# Patient Record
Sex: Male | Born: 2000 | Race: Black or African American | Hispanic: No | Marital: Single | Smoking: Never smoker
Health system: Southern US, Community
[De-identification: ages and names within clinical notes are randomized; demographics above are authoritative.]

## PROBLEM LIST (undated history)

## (undated) DIAGNOSIS — J45909 Unspecified asthma, uncomplicated: Secondary | ICD-10-CM

## (undated) DIAGNOSIS — Q699 Polydactyly, unspecified: Secondary | ICD-10-CM

## (undated) DIAGNOSIS — I82402 Acute embolism and thrombosis of unspecified deep veins of left lower extremity: Secondary | ICD-10-CM

## (undated) HISTORY — PX: URETHRA SURGERY: SHX824

## (undated) HISTORY — PX: ARTHROSCOPIC REPAIR ACL: SUR80

---
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
## 2020-08-23 ENCOUNTER — Encounter (HOSPITAL_BASED_OUTPATIENT_CLINIC_OR_DEPARTMENT_OTHER): Payer: Self-pay | Admitting: Orthopaedic Surgery

## 2020-08-23 ENCOUNTER — Other Ambulatory Visit (HOSPITAL_COMMUNITY)
Admission: RE | Admit: 2020-08-23 | Discharge: 2020-08-23 | Disposition: A | Discharge status: Home / Self Care | Payer: BC Managed Care – PPO | Source: Ambulatory Visit | Attending: Orthopaedic Surgery | Admitting: Orthopaedic Surgery

## 2020-08-23 ENCOUNTER — Other Ambulatory Visit: Payer: Self-pay

## 2020-08-23 DIAGNOSIS — Y838 Other surgical procedures as the cause of abnormal reaction of the patient, or of later complication, without mention of misadventure at the time of the procedure: Secondary | ICD-10-CM | POA: Diagnosis not present

## 2020-08-23 DIAGNOSIS — Z86718 Personal history of other venous thrombosis and embolism: Secondary | ICD-10-CM | POA: Diagnosis not present

## 2020-08-23 DIAGNOSIS — U071 COVID-19: Secondary | ICD-10-CM | POA: Insufficient documentation

## 2020-08-23 DIAGNOSIS — Z01812 Encounter for preprocedural laboratory examination: Secondary | ICD-10-CM | POA: Insufficient documentation

## 2020-08-23 DIAGNOSIS — Z7901 Long term (current) use of anticoagulants: Secondary | ICD-10-CM | POA: Diagnosis not present

## 2020-08-23 DIAGNOSIS — L02416 Cutaneous abscess of left lower limb: Secondary | ICD-10-CM | POA: Diagnosis not present

## 2020-08-23 DIAGNOSIS — T8142XA Infection following a procedure, deep incisional surgical site, initial encounter: Secondary | ICD-10-CM | POA: Diagnosis not present

## 2020-08-23 LAB — SARS CORONAVIRUS 2 (TAT 6-24 HRS): SARS Coronavirus 2: POSITIVE — AB

## 2020-08-24 ENCOUNTER — Other Ambulatory Visit: Payer: Self-pay

## 2020-08-24 ENCOUNTER — Telehealth: Payer: Self-pay | Admitting: Hematology and Oncology

## 2020-08-24 ENCOUNTER — Encounter (HOSPITAL_COMMUNITY): Payer: Self-pay | Admitting: Orthopaedic Surgery

## 2020-08-24 NOTE — Telephone Encounter (Signed)
Received a new hem referral from [facility] for +dvt. Blake Perry's mom returned my call and has been scheduled to see Dr. Al Pimple on 2/2 at 1pm. Aware to arrive 20 minutes early.

## 2020-08-24 NOTE — Progress Notes (Signed)
  [id number] [place], [place] - [phone] AT [facility] [address] [place] [address] Phone: [phone] Fax: [phone]  [id number] - [place], [place] - [address] AT [facility] [address] [place] [address] Phone: [phone] Fax: [phone]   Instructed patient to call [phone] on arrival to hospital main entrance and that we would come get him from the car.    Your procedure is scheduled on January Thursday 27th .  Report to [facility] Main Entrance "A" at 9:30 A.M., and check in at the Admitting office.  Call this number if you have problems the morning of surgery:  [phone]  Call [phone] if you have any questions prior to your surgery date Monday-Friday 8am-4pm    Remember:  Do not eat after midnight the night before your surgery  You may drink clear liquids until 9am the morning of your surgery.   Clear liquids allowed are: Water, Non-Citrus Juices (without pulp), Carbonated Beverages, Clear Tea, Black Coffee Only, and Gatorade    Take these medicines the morning of surgery with A SIP OF WATER NONE Patient stated no Eliquis in 2 weeks and Lovenox last taken 08/24/20 - takes Lovenox in late afternoon - 1630.  As of today, STOP taking any Aspirin (unless otherwise instructed by your surgeon) Aleve, Naproxen, Ibuprofen, Motrin, Advil, Goody's, BC's, all herbal medications, fish oil, and all vitamins.                      Do not wear jewelry            Do not wear lotions, powders, colognes, or deodorant.            Do not shave 48 hours prior to surgery.  Men may shave face and neck.            Do not bring valuables to the hospital.            [facility] is not responsible for any belongings or valuables.  Do NOT Smoke (Tobacco/Vaping) or drink Alcohol 24 hours prior to your procedure   Contacts, glasses, dentures or bridgework may not be worn into  surgery.      Patients discharged the day of surgery will not be allowed to drive home, and someone needs to stay with them for 24 hours.

## 2020-08-24 NOTE — Progress Notes (Signed)
Patient with positive covid result. Contacted MD and informed of result.   

## 2020-08-25 ENCOUNTER — Encounter (HOSPITAL_COMMUNITY)
Admission: RE | Disposition: A | Discharge status: Home / Self Care | Payer: Self-pay | Source: Home / Self Care | Attending: Orthopaedic Surgery

## 2020-08-25 ENCOUNTER — Ambulatory Visit (HOSPITAL_COMMUNITY)
Admission: RE | Admit: 2020-08-25 | Discharge: 2020-08-25 | Disposition: A | Discharge status: Home / Self Care | Payer: BC Managed Care – PPO | Attending: Orthopaedic Surgery | Admitting: Orthopaedic Surgery

## 2020-08-25 ENCOUNTER — Encounter (HOSPITAL_COMMUNITY): Payer: Self-pay | Admitting: Orthopaedic Surgery

## 2020-08-25 ENCOUNTER — Ambulatory Visit (HOSPITAL_COMMUNITY): Payer: BC Managed Care – PPO | Admitting: Anesthesiology

## 2020-08-25 DIAGNOSIS — L02416 Cutaneous abscess of left lower limb: Secondary | ICD-10-CM | POA: Insufficient documentation

## 2020-08-25 DIAGNOSIS — T8142XA Infection following a procedure, deep incisional surgical site, initial encounter: Secondary | ICD-10-CM | POA: Insufficient documentation

## 2020-08-25 DIAGNOSIS — Z86718 Personal history of other venous thrombosis and embolism: Secondary | ICD-10-CM | POA: Insufficient documentation

## 2020-08-25 DIAGNOSIS — Z7901 Long term (current) use of anticoagulants: Secondary | ICD-10-CM | POA: Insufficient documentation

## 2020-08-25 DIAGNOSIS — U071 COVID-19: Secondary | ICD-10-CM | POA: Insufficient documentation

## 2020-08-25 DIAGNOSIS — Z8616 Personal history of COVID-19: Secondary | ICD-10-CM | POA: Insufficient documentation

## 2020-08-25 DIAGNOSIS — Y838 Other surgical procedures as the cause of abnormal reaction of the patient, or of later complication, without mention of misadventure at the time of the procedure: Secondary | ICD-10-CM | POA: Insufficient documentation

## 2020-08-25 HISTORY — DX: Unspecified asthma, uncomplicated: J45.909

## 2020-08-25 HISTORY — DX: Acute embolism and thrombosis of unspecified deep veins of left lower extremity: I82.402

## 2020-08-25 HISTORY — PX: KNEE ARTHROSCOPY: SHX127

## 2020-08-25 HISTORY — PX: INCISION AND DRAINAGE: SHX5863

## 2020-08-25 LAB — POCT I-STAT, CHEM 8
BUN: 9 mg/dL (ref 6–20)
Calcium, Ion: 1.3 mmol/L (ref 1.15–1.40)
Chloride: 98 mmol/L (ref 98–111)
Creatinine, Ser: 0.8 mg/dL (ref 0.61–1.24)
Glucose, Bld: 97 mg/dL (ref 70–99)
HCT: 42 % (ref 39.0–52.0)
Hemoglobin: 14.3 g/dL (ref 13.0–17.0)
Potassium: 4.1 mmol/L (ref 3.5–5.1)
Sodium: 139 mmol/L (ref 135–145)
TCO2: 28 mmol/L (ref 22–32)

## 2020-08-25 LAB — CBC
HCT: 17.6 % — ABNORMAL LOW (ref 39.0–52.0)
Hemoglobin: 5 g/dL — CL (ref 13.0–17.0)
MCH: 28.6 pg (ref 26.0–34.0)
MCHC: 28.4 g/dL — ABNORMAL LOW (ref 30.0–36.0)
MCV: 100.6 fL — ABNORMAL HIGH (ref 80.0–100.0)
Platelets: 64 10*3/uL — ABNORMAL LOW (ref 150–400)
RBC: 1.75 MIL/uL — ABNORMAL LOW (ref 4.22–5.81)
RDW: 12.1 % (ref 11.5–15.5)
WBC: 1.6 10*3/uL — ABNORMAL LOW (ref 4.0–10.5)
nRBC: 0 % (ref 0.0–0.2)

## 2020-08-25 SURGERY — ARTHROSCOPY, KNEE
Anesthesia: General | Site: Knee | Laterality: Left

## 2020-08-25 MED ORDER — SODIUM CHLORIDE 0.9 % IR SOLN
Status: DC | PRN
  Administered 2020-08-25: 6000 mL
  Administered 2020-08-25: 3000 mL

## 2020-08-25 MED ORDER — VANCOMYCIN HCL 1000 MG IV SOLR
INTRAVENOUS | Status: AC
  Filled 2020-08-25: qty 1000

## 2020-08-25 MED ORDER — PROPOFOL 10 MG/ML IV BOLUS
INTRAVENOUS | Status: AC
  Filled 2020-08-25: qty 20

## 2020-08-25 MED ORDER — RIVAROXABAN 20 MG PO TABS: 20.0000 mg | ORAL_TABLET | Freq: Every day | ORAL | 0 refills | Status: DC

## 2020-08-25 MED ORDER — TOBRAMYCIN SULFATE 1.2 G IJ SOLR
INTRAMUSCULAR | Status: DC | PRN
  Administered 2020-08-25: 1.2 g

## 2020-08-25 MED ORDER — LIDOCAINE 2% (20 MG/ML) 5 ML SYRINGE
INTRAMUSCULAR | Status: DC | PRN
  Administered 2020-08-25: 100 mg via INTRAVENOUS

## 2020-08-25 MED ORDER — OXYCODONE HCL 5 MG PO TABS
ORAL_TABLET | ORAL | Status: AC
  Filled 2020-08-25: qty 1

## 2020-08-25 MED ORDER — CEFAZOLIN SODIUM-DEXTROSE 2-4 GM/100ML-% IV SOLN
2.0000 g | INTRAVENOUS | Status: AC
  Administered 2020-08-25: 2 g via INTRAVENOUS

## 2020-08-25 MED ORDER — CEFAZOLIN SODIUM-DEXTROSE 2-4 GM/100ML-% IV SOLN
INTRAVENOUS | Status: AC
  Filled 2020-08-25: qty 100

## 2020-08-25 MED ORDER — OXYCODONE HCL 5 MG PO TABS
5.0000 mg | ORAL_TABLET | Freq: Once | ORAL | Status: AC | PRN
  Administered 2020-08-25: 5 mg via ORAL

## 2020-08-25 MED ORDER — ONDANSETRON HCL 4 MG/2ML IJ SOLN
INTRAMUSCULAR | Status: DC | PRN
  Administered 2020-08-25: 4 mg via INTRAVENOUS

## 2020-08-25 MED ORDER — MIDAZOLAM HCL 2 MG/2ML IJ SOLN
INTRAMUSCULAR | Status: DC | PRN
  Administered 2020-08-25: 2 mg via INTRAVENOUS

## 2020-08-25 MED ORDER — OXYCODONE HCL 5 MG/5ML PO SOLN: 5.0000 mg | Freq: Once | ORAL | Status: AC | PRN

## 2020-08-25 MED ORDER — BUPIVACAINE HCL (PF) 0.25 % IJ SOLN
INTRAMUSCULAR | Status: AC
  Filled 2020-08-25: qty 30

## 2020-08-25 MED ORDER — CHLORHEXIDINE GLUCONATE 0.12 % MT SOLN: 15.0000 mL | OROMUCOSAL | Status: AC

## 2020-08-25 MED ORDER — FENTANYL CITRATE (PF) 250 MCG/5ML IJ SOLN
INTRAMUSCULAR | Status: DC | PRN
  Administered 2020-08-25: 150 ug via INTRAVENOUS
  Administered 2020-08-25 (×2): 50 ug via INTRAVENOUS

## 2020-08-25 MED ORDER — ONDANSETRON HCL 4 MG/2ML IJ SOLN: 4.0000 mg | Freq: Once | INTRAMUSCULAR | Status: DC | PRN

## 2020-08-25 MED ORDER — DEXAMETHASONE SODIUM PHOSPHATE 10 MG/ML IJ SOLN
INTRAMUSCULAR | Status: DC | PRN
  Administered 2020-08-25: 5 mg via INTRAVENOUS

## 2020-08-25 MED ORDER — CHLORHEXIDINE GLUCONATE 0.12 % MT SOLN
OROMUCOSAL | Status: AC
  Administered 2020-08-25: 15 mL via OROMUCOSAL
  Filled 2020-08-25: qty 15

## 2020-08-25 MED ORDER — HYDROMORPHONE HCL 1 MG/ML IJ SOLN: 0.2500 mg | INTRAMUSCULAR | Status: DC | PRN

## 2020-08-25 MED ORDER — OXYCODONE HCL 5 MG PO TABS: ORAL_TABLET | ORAL | 0 refills | Status: AC

## 2020-08-25 MED ORDER — ACETAMINOPHEN 500 MG PO TABS: 1000.0000 mg | ORAL_TABLET | Freq: Three times a day (TID) | ORAL | 0 refills | Status: AC

## 2020-08-25 MED ORDER — ONDANSETRON HCL 4 MG PO TABS: 4.0000 mg | ORAL_TABLET | Freq: Three times a day (TID) | ORAL | 1 refills | Status: AC | PRN

## 2020-08-25 MED ORDER — FENTANYL CITRATE (PF) 250 MCG/5ML IJ SOLN
INTRAMUSCULAR | Status: AC
  Filled 2020-08-25: qty 5

## 2020-08-25 MED ORDER — PROPOFOL 10 MG/ML IV BOLUS
INTRAVENOUS | Status: DC | PRN
  Administered 2020-08-25: 200 mg via INTRAVENOUS

## 2020-08-25 MED ORDER — BUPIVACAINE HCL (PF) 0.25 % IJ SOLN
INTRAMUSCULAR | Status: DC | PRN
  Administered 2020-08-25: 30 mL

## 2020-08-25 MED ORDER — BUPIVACAINE HCL (PF) 0.25 % IJ SOLN: INTRAMUSCULAR | Status: DC | PRN

## 2020-08-25 MED ORDER — MIDAZOLAM HCL 2 MG/2ML IJ SOLN
INTRAMUSCULAR | Status: AC
  Filled 2020-08-25: qty 2

## 2020-08-25 MED ORDER — VANCOMYCIN HCL 1000 MG IV SOLR
INTRAVENOUS | Status: DC | PRN
  Administered 2020-08-25: 1000 mg

## 2020-08-25 MED ORDER — LACTATED RINGERS IV SOLN: INTRAVENOUS | Status: DC

## 2020-08-25 MED ORDER — SULFAMETHOXAZOLE-TRIMETHOPRIM 800-160 MG PO TABS: 1.0000 | ORAL_TABLET | Freq: Two times a day (BID) | ORAL | 0 refills | Status: AC

## 2020-08-25 MED ORDER — 0.9 % SODIUM CHLORIDE (POUR BTL) OPTIME
TOPICAL | Status: DC | PRN
  Administered 2020-08-25: 1000 mL

## 2020-08-25 MED ORDER — BUPIVACAINE HCL 0.25 % IJ SOLN: INTRAMUSCULAR | Status: DC | PRN

## 2020-08-25 MED ORDER — TOBRAMYCIN SULFATE 1.2 G IJ SOLR
INTRAMUSCULAR | Status: AC
  Filled 2020-08-25: qty 1.2

## 2020-08-25 SURGICAL SUPPLY — 106 items
APL PRP STRL LF DISP 70% ISPRP (MISCELLANEOUS) ×2
BANDAGE ESMARK 6X9 LF (GAUZE/BANDAGES/DRESSINGS) ×2 IMPLANT
BLADE 4.2CUDA (BLADE) IMPLANT
BLADE AVERAGE 25X9 (BLADE) ×3 IMPLANT
BLADE CUDA 5.5 (BLADE) IMPLANT
BLADE CUDA GRT WHITE 3.5 (BLADE) IMPLANT
BLADE CUTTER GATOR 3.5 (BLADE) ×3 IMPLANT
BLADE CUTTER MENIS 5.5 (BLADE) IMPLANT
BLADE EXCALIBUR 4.0X13 (MISCELLANEOUS) ×3 IMPLANT
BLADE SURG 15 STRL LF DISP TIS (BLADE) ×2 IMPLANT
BLADE SURG 15 STRL SS (BLADE) ×3
BNDG CMPR 9X6 STRL LF SNTH (GAUZE/BANDAGES/DRESSINGS) ×2
BNDG ELASTIC 4X5.8 VLCR STR LF (GAUZE/BANDAGES/DRESSINGS) ×3 IMPLANT
BNDG ELASTIC 6X5.8 VLCR STR LF (GAUZE/BANDAGES/DRESSINGS) ×3 IMPLANT
BNDG ESMARK 6X9 LF (GAUZE/BANDAGES/DRESSINGS) ×3
BNDG GAUZE ELAST 4 BULKY (GAUZE/BANDAGES/DRESSINGS) ×2 IMPLANT
BUR EGG 3PK/BX (BURR) ×2 IMPLANT
BUR OVAL 4.0 (BURR) ×1 IMPLANT
CHLORAPREP W/TINT 26 (MISCELLANEOUS) ×3 IMPLANT
CLSR STERI-STRIP ANTIMIC 1/2X4 (GAUZE/BANDAGES/DRESSINGS) ×3 IMPLANT
CNTNR URN SCR LID CUP LEK RST (MISCELLANEOUS) IMPLANT
CONT SPEC 4OZ STRL OR WHT (MISCELLANEOUS)
COVER BACK TABLE 60X90IN (DRAPES) ×3 IMPLANT
COVER SURGICAL LIGHT HANDLE (MISCELLANEOUS) ×3 IMPLANT
COVER WAND RF STERILE (DRAPES) ×3 IMPLANT
CUFF TOURN SGL LL 12 NO SLV (MISCELLANEOUS) IMPLANT
CUFF TOURN SGL QUICK 34 (TOURNIQUET CUFF)
CUFF TRNQT CYL 34X4.125X (TOURNIQUET CUFF) IMPLANT
CUTTER MENISCUS  4.2MM (BLADE)
CUTTER MENISCUS 4.2MM (BLADE) IMPLANT
DECANTER SPIKE VIAL GLASS SM (MISCELLANEOUS) IMPLANT
DRAPE ARTHROSCOPY W/POUCH 114 (DRAPES) ×3 IMPLANT
DRAPE IMP U-DRAPE 54X76 (DRAPES) ×3 IMPLANT
DRAPE U-SHAPE 47X51 STRL (DRAPES) ×3 IMPLANT
DRSG EMULSION OIL 3X3 NADH (GAUZE/BANDAGES/DRESSINGS) IMPLANT
DRSG PAD ABDOMINAL 8X10 ST (GAUZE/BANDAGES/DRESSINGS) ×6 IMPLANT
DRSG TEGADERM 4X4.75 (GAUZE/BANDAGES/DRESSINGS) ×1 IMPLANT
DURAPREP 26ML APPLICATOR (WOUND CARE) ×3 IMPLANT
ELECT REM PT RETURN 9FT ADLT (ELECTROSURGICAL) ×3
ELECTRODE REM PT RTRN 9FT ADLT (ELECTROSURGICAL) ×2 IMPLANT
GAUZE SPONGE 4X4 12PLY STRL (GAUZE/BANDAGES/DRESSINGS) ×4 IMPLANT
GAUZE XEROFORM 1X8 LF (GAUZE/BANDAGES/DRESSINGS) ×2 IMPLANT
GAUZE XEROFORM 5X9 LF (GAUZE/BANDAGES/DRESSINGS) ×3 IMPLANT
GLOVE BIO SURGEON STRL SZ 6.5 (GLOVE) ×3 IMPLANT
GLOVE ECLIPSE 8.0 STRL XLNG CF (GLOVE) ×6 IMPLANT
GLOVE SRG 8 PF TXTR STRL LF DI (GLOVE) ×2 IMPLANT
GLOVE SURG UNDER LTX SZ6.5 (GLOVE) ×3 IMPLANT
GLOVE SURG UNDER POLY LF SZ8 (GLOVE) ×3
GOWN STRL REUS W/ TWL LRG LVL3 (GOWN DISPOSABLE) ×6 IMPLANT
GOWN STRL REUS W/ TWL XL LVL3 (GOWN DISPOSABLE) ×4 IMPLANT
GOWN STRL REUS W/TWL LRG LVL3 (GOWN DISPOSABLE) ×9
GOWN STRL REUS W/TWL XL LVL3 (GOWN DISPOSABLE) ×6
HANDPIECE INTERPULSE COAX TIP (DISPOSABLE)
IMMOBILIZER KNEE 22 UNIV (SOFTGOODS) ×2 IMPLANT
IMMOBILIZER KNEE 24 THIGH 36 (MISCELLANEOUS) ×2 IMPLANT
IMMOBILIZER KNEE 24 UNIV (MISCELLANEOUS)
IV NS IRRIG 3000ML ARTHROMATIC (IV SOLUTION) ×12 IMPLANT
KIT BASIN OR (CUSTOM PROCEDURE TRAY) ×3 IMPLANT
KIT TRANSTIBIAL (DISPOSABLE) IMPLANT
KIT TURNOVER KIT B (KITS) ×3 IMPLANT
KNIFE GRAFT ACL 10MM 5952 (MISCELLANEOUS) IMPLANT
KNIFE GRAFT ACL 9MM (MISCELLANEOUS) IMPLANT
MANIFOLD NEPTUNE II (INSTRUMENTS) ×3 IMPLANT
NS IRRIG 1000ML POUR BTL (IV SOLUTION) ×3 IMPLANT
PACK ARTHROSCOPY DSU (CUSTOM PROCEDURE TRAY) ×3 IMPLANT
PACK ORTHO EXTREMITY (CUSTOM PROCEDURE TRAY) ×3 IMPLANT
PAD ARMBOARD 7.5X6 YLW CONV (MISCELLANEOUS) ×6 IMPLANT
PAD CAST 4YDX4 CTTN HI CHSV (CAST SUPPLIES) ×2 IMPLANT
PADDING CAST COTTON 4X4 STRL (CAST SUPPLIES)
PADDING CAST COTTON 6X4 STRL (CAST SUPPLIES) ×2 IMPLANT
PADDING CAST SYNTHETIC 4 (CAST SUPPLIES) ×1
PADDING CAST SYNTHETIC 4X4 STR (CAST SUPPLIES) ×1 IMPLANT
PASSER SUT SWANSON 36MM LOOP (INSTRUMENTS) IMPLANT
PENCIL BUTTON HOLSTER BLD 10FT (ELECTRODE) IMPLANT
PROBE BIPOLAR ATHRO 135MM 90D (MISCELLANEOUS) ×3 IMPLANT
SET HNDPC FAN SPRY TIP SCT (DISPOSABLE) IMPLANT
SLEEVE SCD COMPRESS KNEE MED (MISCELLANEOUS) IMPLANT
SPONGE LAP 18X18 RF (DISPOSABLE) IMPLANT
SPONGE LAP 4X18 RFD (DISPOSABLE) IMPLANT
SUCTION FRAZIER HANDLE 10FR (MISCELLANEOUS)
SUCTION TUBE FRAZIER 10FR DISP (MISCELLANEOUS) IMPLANT
SUT ETHILON 2 0 FS 18 (SUTURE) ×5 IMPLANT
SUT ETHILON 3 0 PS 1 (SUTURE) IMPLANT
SUT FIBERWIRE #2 38 T-5 BLUE (SUTURE) ×6
SUT MNCRL AB 4-0 PS2 18 (SUTURE) IMPLANT
SUT MON AB 2-0 CT1 36 (SUTURE) IMPLANT
SUT PROLENE 2 0 CT2 30 (SUTURE) ×1 IMPLANT
SUT PROLENE 3 0 PS 2 (SUTURE) IMPLANT
SUT VIC AB 0 SH 27 (SUTURE) ×3 IMPLANT
SUT VIC AB 2-0 SH 27 (SUTURE) ×3
SUT VIC AB 2-0 SH 27XBRD (SUTURE) ×2 IMPLANT
SUT VIC AB 3-0 SH 27 (SUTURE)
SUT VIC AB 3-0 SH 27X BRD (SUTURE) IMPLANT
SUT VICRYL 4-0 PS2 18IN ABS (SUTURE) IMPLANT
SUTURE FIBERWR #2 38 T-5 BLUE (SUTURE) ×4 IMPLANT
SWAB CULTURE ESWAB REG 1ML (MISCELLANEOUS) IMPLANT
TAPE CLOTH 3X10 TAN LF (GAUZE/BANDAGES/DRESSINGS) IMPLANT
TOWEL GREEN STERILE (TOWEL DISPOSABLE) ×3 IMPLANT
TOWEL GREEN STERILE FF (TOWEL DISPOSABLE) ×3 IMPLANT
TOWEL OR NON WOVEN STRL DISP B (DISPOSABLE) ×6 IMPLANT
TUBE CONNECTING 12X1/4 (SUCTIONS) ×3 IMPLANT
TUBING ARTHROSCOPY IRRIG 16FT (MISCELLANEOUS) ×3 IMPLANT
UNDERPAD 30X36 HEAVY ABSORB (UNDERPADS AND DIAPERS) ×3 IMPLANT
WATER STERILE IRR 1000ML POUR (IV SOLUTION) ×3 IMPLANT
WRAP KNEE MAXI GEL POST OP (GAUZE/BANDAGES/DRESSINGS) ×3 IMPLANT
YANKAUER SUCT BULB TIP NO VENT (SUCTIONS) ×3 IMPLANT

## 2020-08-25 NOTE — Anesthesia Preprocedure Evaluation (Addendum)
Anesthesia Evaluation  Patient identified by MRN, date of birth, ID band Patient awake    Reviewed: Allergy & Precautions, NPO status , Patient's Chart, lab work & pertinent test results  Airway Mallampati: II  TM Distance: >3 FB Neck ROM: Full   Comment: Pierced right nostril Dental no notable dental hx. (+) Teeth Intact   Pulmonary asthma ,  Covid + on 1/25, asymptomatic   Pulmonary exam normal breath sounds clear to auscultation       Cardiovascular + DVT  Normal cardiovascular exam Rhythm:Regular Rate:Normal  DVT left lower leg   Neuro/Psych negative neurological ROS  negative psych ROS   GI/Hepatic negative GI ROS, Neg liver ROS,   Endo/Other  negative endocrine ROS  Renal/GU negative Renal ROS  negative genitourinary   Musculoskeletal Infected left knee S/P ACL repair in [place] on 07/13/20   Abdominal   Peds  Hematology   Anesthesia Other Findings   Reproductive/Obstetrics                            Anesthesia Physical Anesthesia Plan  ASA: II  Anesthesia Plan: General   Post-op Pain Management:    Induction: Intravenous  PONV Risk Score and Plan: 3 and Treatment may vary due to age or medical condition, Ondansetron and Midazolam  Airway Management Planned: LMA  Additional Equipment:   Intra-op Plan:   Post-operative Plan: Extubation in OR  Informed Consent: I have reviewed the patients History and Physical, chart, labs and discussed the procedure including the risks, benefits and alternatives for the proposed anesthesia with the patient or authorized representative who has indicated his/her understanding and acceptance.     Dental advisory given  Plan Discussed with: CRNA and Anesthesiologist  Anesthesia Plan Comments:         Anesthesia Quick Evaluation

## 2020-08-25 NOTE — Anesthesia Postprocedure Evaluation (Signed)
Anesthesia Post Note  Patient: Blake Perry  Procedure(s) Performed: ARTHROSCOPY LEFT KNEE FOR INFECTION A LAVAGE AND DRAINAGE (Left Knee) INCISION AND DRAINAGE LEG DEEP ABSCESS/HEMATOMA (Left )     Patient location during evaluation: PACU Anesthesia Type: General Level of consciousness: awake and alert and oriented Pain management: pain level controlled Vital Signs Assessment: post-procedure vital signs reviewed and stable Respiratory status: spontaneous breathing, nonlabored ventilation and respiratory function stable Cardiovascular status: blood pressure returned to baseline and stable Postop Assessment: no apparent nausea or vomiting Anesthetic complications: no   No complications documented.  Last Vitals:  Vitals:   08/25/20 1334 08/25/20 1354  BP: (!) 155/84 (!) 157/83  Pulse: 71   Resp:    Temp:  36.8 C  SpO2: 99%     Last Pain:  Vitals:   08/25/20 1304  TempSrc:   PainSc: 0-No pain                 Micajah Dennin A.

## 2020-08-25 NOTE — Transfer of Care (Signed)
Immediate Anesthesia Transfer of Care Note  Patient: Blake Perry  Procedure(s) Performed: ARTHROSCOPY LEFT KNEE FOR INFECTION A LAVAGE AND DRAINAGE (Left Knee) INCISION AND DRAINAGE LEG DEEP ABSCESS/HEMATOMA (Left )  Patient Location: PACU  Anesthesia Type:General  Level of Consciousness: drowsy and patient cooperative  Airway & Oxygen Therapy: Patient Spontanous Breathing  Post-op Assessment: Report given to RN  Post vital signs: Reviewed and stable  Last Vitals:  Vitals Value Taken Time  BP 167/98 08/25/20 1323  Temp    Pulse 68 08/25/20 1323  Resp    SpO2 98 % 08/25/20 1323  Vitals shown include unvalidated device data.  Last Pain:  Vitals:   08/25/20 1304  TempSrc:   PainSc: 0-No pain      Patients Stated Pain Goal: 3 (08/25/20 1015)  Complications: No complications documented.

## 2020-08-25 NOTE — Interval H&P Note (Signed)
All questions answered. We will proceed.

## 2020-08-25 NOTE — OR Nursing (Signed)
Dr. Everardo Pacific informed regarding critical lab value of hemoglobin 5.0 and WBC of 1.6.  Would like to redraw labs.

## 2020-08-25 NOTE — Discharge Instructions (Signed)
General Anesthesia, Adult, Care After This sheet gives you information about how to care for yourself after your procedure. Your health care provider may also give you more specific instructions. If you have problems or questions, contact your health care provider. What can I expect after the procedure? After the procedure, the following side effects are common:  Pain or discomfort at the IV site.  Nausea.  Vomiting.  Sore throat.  Trouble concentrating.  Feeling cold or chills.  Feeling weak or tired.  Sleepiness and fatigue.  Soreness and body aches. These side effects can affect parts of the body that were not involved in surgery. Follow these instructions at home: For the time period you were told by your health care provider:  Rest.  Do not participate in activities where you could fall or become injured.  Do not drive or use machinery.  Do not drink alcohol.  Do not take sleeping pills or medicines that cause drowsiness.  Do not make important decisions or sign legal documents.  Do not take care of children on your own.   Eating and drinking  Follow any instructions from your health care provider about eating or drinking restrictions.  When you feel hungry, start by eating small amounts of foods that are soft and easy to digest (bland), such as toast. Gradually return to your regular diet.  Drink enough fluid to keep your urine pale yellow.  If you vomit, rehydrate by drinking water, juice, or clear broth. General instructions  If you have sleep apnea, surgery and certain medicines can increase your risk for breathing problems. Follow instructions from your health care provider about wearing your sleep device: ? Anytime you are sleeping, including during daytime naps. ? While taking prescription pain medicines, sleeping medicines, or medicines that make you drowsy.  Have a responsible adult stay with you for the time you are told. It is important to have  someone help care for you until you are awake and alert.  Return to your normal activities as told by your health care provider. Ask your health care provider what activities are safe for you.  Take over-the-counter and prescription medicines only as told by your health care provider.  If you smoke, do not smoke without supervision.  Keep all follow-up visits as told by your health care provider. This is important. Contact a health care provider if:  You have nausea or vomiting that does not get better with medicine.  You cannot eat or drink without vomiting.  You have pain that does not get better with medicine.  You are unable to pass urine.  You develop a skin rash.  You have a fever.  You have redness around your IV site that gets worse. Get help right away if:  You have difficulty breathing.  You have chest pain.  You have blood in your urine or stool, or you vomit blood. Summary  After the procedure, it is common to have a sore throat or nausea. It is also common to feel tired.  Have a responsible adult stay with you for the time you are told. It is important to have someone help care for you until you are awake and alert.  When you feel hungry, start by eating small amounts of foods that are soft and easy to digest (bland), such as toast. Gradually return to your regular diet.  Drink enough fluid to keep your urine pale yellow.  Return to your normal activities as told by your health care provider.   Ask your health care provider what activities are safe for you. This information is not intended to replace advice given to you by your health care provider. Make sure you discuss any questions you have with your health care provider. Document Revised: 03/31/2020 Document Reviewed: 10/29/2019 Elsevier Patient Education  2021 [employer] Ramond Marrow MD, MPH Alfonse Alpers, PA-C [facility] [address] [phone] (tel)    [phone] (fax)   POST-OPERATIVE INSTRUCTIONS - Knee Arthroscopy  WOUND CARE - You may remove the Operative Dressing on Post-Op Day #3 (72hrs after surgery).   -  Alternatively if you would like you can leave dressing on until follow-up if within 7-8 days but keep it dry. - Leave steri-strips in place until they fall off on their own, usually 2 weeks postop. - An ACE wrap may be used to control swelling, do not wrap this too tight.  If the initial ACE wrap feels too tight you may loosen it. - There may be a small amount of fluid/bleeding leaking at the surgical site.  - This is normal; the knee is filled with fluid during the procedure and can leak for 24-48hrs after surgery. You may change/reinforce the bandage as needed.  - Use the Cryocuff or Ice as often as possible for the first 7 days, then as needed for pain relief. Always keep a towel, ACE wrap or other barrier between the cooling unit and your skin.  - You may shower on Post-Op Day #3. Gently pat the area dry. Do not soak the knee in water or submerge it.  - Do not go swimming in the pool or ocean until 4 weeks after surgery or when otherwise instructed.  Keep dry incisions as dry as possible.   BRACE/AMBULATION -  You may use crutches initially to help you ambulate, but this is not required -  You can put full weight on your operative leg as you feel comfortable   REGIONAL ANESTHESIA (NERVE BLOCKS) - The anesthesia team may have performed a nerve block for you if safe in the setting of your care.  This is a great tool used to minimize pain.  Typically the block may start wearing off overnight.  This can be a challenging period but please utilize your as needed pain medications to try and manage this period and know it will be a brief transition as the nerve block wears completely   POST-OP MEDICATIONS - Multimodal approach to pain control - In general your pain will be controlled with a combination of substances.   Prescriptions unless otherwise discussed are electronically sent to your pharmacy.  This is a carefully made plan we use to minimize narcotic use.      - Acetaminophen - Non-narcotic pain medicine taken on a scheduled basis    - Take 2 (500mg ) tablets (1,000mg  total) every 8 hours  - Oxycodone - This is a strong narcotic, to be used only on an "as needed" basis for pain.   - Take 1/2 to 1 tablet every 6 hours as needed for SEVERE pain - Xarelto - This medicine is used to minimize the risk of blood clots after surgery. -  Zofran - take as needed for nausea - Bactrim - this is an antibiotic   - Take 1 tablet twice a day   FOLLOW-UP   Please call the office to schedule a follow-up appointment for your incision check, 7-10 days post-operatively.  IF YOU HAVE ANY QUESTIONS, PLEASE FEEL FREE TO  CALL OUR OFFICE.   HELPFUL INFORMATION  - If you had a block, it will wear off between 8-24 hrs postop typically.  This is period when your pain may go from nearly zero to the pain you would have had post-op without the block.  This is an abrupt transition but nothing dangerous is happening.  You may take an extra dose of narcotic when this happens.   Keep your leg elevated to decrease swelling, which will then in turn decrease your pain. I would elevate the foot of your bed by putting a couple of couch pillows between your mattress and box spring. I would not keep pillow directly under your ankle.  - Do not sleep with a pillow behind your knee even if it is more comfortable as this may make it harder to get your knee fully straight long term.   There will be MORE swelling on days 1-3 than there is on the day of surgery.  This also is normal. The swelling will decrease with the anti-inflammatory medication, ice and keeping it elevated. The swelling will make it more difficult to bend your knee. As the swelling goes down your motion will become easier   You may develop swelling and bruising that  extends from your knee down to your calf and perhaps even to your foot over the next week. Do not be alarmed. This too is normal, and it is due to gravity   There may be some numbness adjacent to the incision site. This may last for 6-12 months or longer in some patients and is expected.   You may return to sedentary work/school in the next couple of days when you feel up to it. You will need to keep your leg elevated as much as possible    You should wean off your narcotic medicines as soon as you are able.  Most patients will be off or using minimal narcotics before their first postop appointment.    We suggest you use the pain medication the first night prior to going to bed, in order to ease any pain when the anesthesia wears off. You should avoid taking pain medications on an empty stomach as it will make you nauseous.   Do not drink alcoholic beverages or take illicit drugs when taking pain medications.   It is against the law to drive while taking narcotics. You cannot drive if your Right leg is in brace locked in extension.   Pain medication may make you constipated.  Below are a few solutions to try in this order:  o Decrease the amount of pain medication if you aren't having pain.  o Drink lots of decaffeinated fluids.  o Drink prune juice and/or eat dried prunes   o If the first 3 don't work start with additional solutions  o Take Colace - an over-the-counter stool softener  o Take Senokot - an over-the-counter laxative  o Take Miralax - a stronger over-the-counter laxative    For more information including helpful videos and documents visit our website:   [email or web address]

## 2020-08-25 NOTE — Progress Notes (Signed)
Orthopedic Tech Progress Note Patient Details:  Blake Perry December 28, 2000 [id number] Left crutches at OR front desk Ortho Devices Type of Ortho Device: Crutches Ortho Device/Splint Interventions: Ordered,Adjustment   Post Interventions Patient Tolerated: Other (comment) Instructions Provided: Other (comment)   Michelle Piper 08/25/2020, 3:33 PM

## 2020-08-25 NOTE — Op Note (Addendum)
Orthopaedic Surgery Operative Note (CSN: [id number])  Blake Perry  04-10-2001 Date of Surgery: 08/25/2020   Diagnoses:  Left knee postoperative infection after ACL reconstruction by outside surgeon  Procedure: Left knee arthroscopic debridement and synovectomy Left deep abscess incision debridement   Operative Finding Exam under anesthesia: Patient had 5 to 7 mm translation slightly increased from his contralateral side on Lachman though he did seem to have an endpoint though relatively soft.  Motion was full otherwise Suprapatellar pouch: Significant redness and inflammation with murky appearing fluid overall Patellofemoral Compartment: Normal Medial Compartment: Normal Lateral Compartment: Normal Intercondylar Notch: ACL fibers are intact with no obvious redness though the PCL has severe redness and synovitis around it there was anterior interval scarring and redness in the anterior interval as well.  Successful completion of the planned procedure.  The patient's arthroscopy evaluation of the knee was done before any contamination could be had from the tibial wound.  There was appearances of the possibility of a septic joint based on the patient's intra-articular appearance.  ACL appeared to take some tension during testing however was not as tight as I would typically expected.    The distal wound had a cavitary hematoma that was about 2 x 3 cm in size.  I was able to visualize the sutures from what appeared to be a backup swivel lock fixation of the tibia and these were still taut.  There was no obvious bone involvement.  We used a rondure and curette to clear this area.  Of note without my knowledge a CBC was drawn preop with a hb of 5.0.  It was rechecked with an iStat and was 14.  Post-operative plan: The patient will be weightbearing to tolerance.  The patient will be discharged home.  DVT prophylaxis Lovenox as he had preoperative DVTs.  Pain control with PRN pain medication  preferring oral medicines.  Follow up plan will be scheduled in approximately 7 days for incision check and XR.  Post-Op Diagnosis: Same Surgeons:Primary: Bjorn Pippin, MD Assistants:Caroline McBane PA-C Location: [facility] OR ROOM 04 Anesthesia: General with local Antibiotics: Ancef with local vancomycin and tobramycin powder Tourniquet time: None Estimated Blood Loss: 5 to 10 cc Complications: None Specimens: None Implants: * No implants in log *  Indications for Surgery:   SON Blake Perry is a 20 y.o. male with ACL reconstruction performed in [place] who unfortunately had DVTs after surgery.  He was placed on anticoagulants and was seen at an outside clinic who removed his sutures and he had a slight wound dehiscence of one of his incisions.  He then continued to drain and was temporarily lost to follow-up and eventually arrived back in my clinic about 5 to 6 weeks after surgery with continued drainage.  We tried antibiotics however he continued to have drainage we worried that he was infected.  Benefits and risks of operative and nonoperative management were discussed prior to surgery with patient/guardian(s) and informed consent form was completed.  Specific risks including infection, need for additional surgery, continued infection, ACL laxity, arthrosis amongst others   Procedure:   The patient was identified properly. Informed consent was obtained and the surgical site was marked. The patient was taken up to suite where general anesthesia was induced. The patient was placed in the supine position with a post against the surgical leg and a nonsterile tourniquet applied. The surgical leg was then prepped and draped usual sterile fashion.  A standard surgical timeout was performed.  2 standard  anterior portals were made and diagnostic arthroscopy performed. Please note the findings as noted above.  We started by isolating the tibial wound which was Horm the tibial tunnel drilling with a  Tegaderm.  This allowed [place] to scope the knee without contamination.  We entered the knee is typical with standard portals.  We performed a careful with a near complete synovectomy and irrigated the knee with 6 L normal saline.  Multiple specimens were taken.  Once this was complete we closed the incisions with nonabsorbable suture and remove the Tegaderm and extended our incision around the open wound over the tibial tunnel.  We found that there is a large hematoma and we cleared the walls of this and freshen the area.  We performed a resection of skin, subcutaneous tissue and periosteum.  We then irrigated with 3 L of normal saline before performing a layered closure using 1-2 interrupted buried sutures of 2-0 PDS as there was a relatively large dead space and then closed the skin with 2-0 nylon after application of local vancomycin and tobramycin powder.   The patient was awoken from general anesthesia and taken to the PACU in stable condition without complication.   Alfonse Alpers, PA-C, present and scrubbed throughout the case, critical for completion in a timely fashion, and for retraction, instrumentation, closure.

## 2020-08-25 NOTE — Anesthesia Procedure Notes (Signed)
Procedure Name: LMA Insertion Date/Time: 08/25/2020 11:58 AM Performed by: De Nurse, CRNA Pre-anesthesia Checklist: Patient identified, Emergency Drugs available, Suction available and Patient being monitored Patient Re-evaluated:Patient Re-evaluated prior to induction Oxygen Delivery Method: Circle System Utilized Preoxygenation: Pre-oxygenation with 100% oxygen Induction Type: IV induction Ventilation: Mask ventilation without difficulty LMA: LMA inserted LMA Size: 4.0 Number of attempts: 1 Placement Confirmation: positive ETCO2 Tube secured with: Tape Dental Injury: Teeth and Oropharynx as per pre-operative assessment

## 2020-08-25 NOTE — H&P (Signed)
PREOPERATIVE H&P  Chief Complaint: INFECTION FOLLOWING A PROCEDURE LEFT KNEE  HPI: Blake Perry is a 20 y.o. male who is scheduled for, Procedure(s): ARTHROSCOPY LEFT KNEE FOR INFECTION A LAVAGE AND DRAINAGE INCISION AND DRAINAGE LEG DEEP ABSCESS/HEMATOMA.   Patient has a past medical history significant for DVT.  He has a complex history - left ACL BTB reconstruction on 07/13/20 in [place], found to have DVT prior to flying back to [place], sutures removed by a different provider, and currently drainage from medial based distal incision. He was seen at our office because of wound concerns.  He did have a short course of antibiotics at some point.   His symptoms are rated as moderate to severe, and have been worsening.  This is significantly impairing activities of daily living.    Please see clinic note for further details on this patient's care.    He has elected for surgical management.   Past Medical History:  Diagnosis Date  . Acute deep vein thrombosis (DVT) of left lower extremity after procedure (HCC)   . Asthma   . Extra digits    Past Surgical History:  Procedure Laterality Date  . ARTHROSCOPIC REPAIR ACL    . URETHRA SURGERY     Social History   Socioeconomic History  . Marital status: Single    Spouse name: Not on file  . Number of children: Not on file  . Years of education: Not on file  . Highest education level: Not on file  Occupational History  . Not on file  Tobacco Use  . Smoking status: Never Smoker  . Smokeless tobacco: Never Used  Vaping Use  . Vaping Use: Former  Substance and Sexual Activity  . Alcohol use: Yes    Alcohol/week: 1.0 standard drink    Types: 1 Cans of beer per week    Comment: beer occasionally  . Drug use: Yes    Types: Marijuana  . Sexual activity: Never  Other Topics Concern  . Not on file  Social History Narrative  . Not on file   Social Determinants of Health   Financial Resource Strain: Not on file  Food  Insecurity: Not on file  Transportation Needs: Not on file  Physical Activity: Not on file  Stress: Not on file  Social Connections: Not on file   History reviewed. No pertinent family history. Allergies  Allergen Reactions  . Bee Venom Swelling    Not anaphylaxis yet   Prior to Admission medications   Medication Sig Start Date End Date Taking? Authorizing Provider  enoxaparin (LOVENOX) 40 MG/0.4ML injection Inject 40 mg into the skin daily.   Yes [provider]  ibuprofen (ADVIL,MOTRIN) 600 MG tablet Take 1 tab PO Q6h x 1-2 days then Q6h prn Patient taking differently: Take by mouth every 6 (six) hours as needed for mild pain or moderate pain. 04/26/14  Yes Lowanda Foster, NP  apixaban (ELIQUIS) 5 MG TABS tablet Take 5 mg by mouth 2 (two) times daily.    [provider]  BACTRIM DS 800-160 MG tablet Take 1 tablet by mouth 2 (two) times daily. 08/19/20   [provider]    ROS: All other systems have been reviewed and were otherwise negative with the exception of those mentioned in the HPI and as above.  Physical Exam: General: Alert, no acute distress Cardiovascular: No pedal edema Respiratory: No cyanosis, no use of accessory musculature GI: No organomegaly, abdomen is soft and non-tender Skin: No  lesions in the area of chief complaint Neurologic: Sensation intact distally Psychiatric: Patient is competent for consent with normal mood and affect Lymphatic: No axillary or cervical lymphadenopathy  MUSCULOSKELETAL:  Left knee: On examination he has a full range of motion of the knee. No Lachman test. He has a small eschar and wound which appears to previously been draining but no expressible drainage currently. Blood drainage on the bandage the patient brought in.   Imaging: Two views of the knee demonstrate bioabsorbable screws with bone plugs which are difficult to assess. The screws appear atypical in appearance.   Assessment: INFECTION FOLLOWING A  PROCEDURE LEFT KNEE  Plan: Plan for Procedure(s): ARTHROSCOPY LEFT KNEE FOR INFECTION A LAVAGE AND DRAINAGE INCISION AND DRAINAGE LEG DEEP ABSCESS/HEMATOMA  The risks benefits and alternatives were discussed with the patient including but not limited to the risks of nonoperative treatment, versus surgical intervention including infection, bleeding, nerve injury,  blood clots, cardiopulmonary complications, morbidity, mortality, among others, and they were willing to proceed.   The patient acknowledged the explanation, agreed to proceed with the plan and consent was signed.   Operative Plan: Left knee scope with irrigation and debridement Discharge Medications: Tylenol, Robaxin, Oxycodone, Zofran, +/- abx DVT Prophylaxis: Lovenox (has prescription) Physical Therapy: continue outpatient PT Special Discharge needs: None   Vernetta Honey, PA-C  08/25/2020 5:51 AM

## 2020-08-26 ENCOUNTER — Encounter (HOSPITAL_COMMUNITY): Payer: Self-pay | Admitting: Orthopaedic Surgery

## 2020-08-29 ENCOUNTER — Telehealth: Payer: Self-pay | Admitting: Hematology and Oncology

## 2020-08-29 NOTE — Telephone Encounter (Signed)
I received a call from the pt's mom to reschedule his appt due to testing + for COVID on 1/26. Pt has been rescheduled to see Dr. Al Pimple to 2/10 at 1pm.

## 2020-08-31 ENCOUNTER — Inpatient Hospital Stay: Payer: BC Managed Care – PPO | Admitting: Hematology and Oncology

## 2020-08-31 ENCOUNTER — Inpatient Hospital Stay: Payer: BC Managed Care – PPO

## 2020-08-31 LAB — AEROBIC/ANAEROBIC CULTURE W GRAM STAIN (SURGICAL/DEEP WOUND)
Culture: NO GROWTH
Culture: NORMAL

## 2020-09-07 ENCOUNTER — Other Ambulatory Visit: Payer: BC Managed Care – PPO

## 2020-09-08 ENCOUNTER — Other Ambulatory Visit: Payer: Self-pay

## 2020-09-08 ENCOUNTER — Telehealth: Payer: Self-pay

## 2020-09-08 ENCOUNTER — Inpatient Hospital Stay: Payer: BC Managed Care – PPO | Attending: Hematology and Oncology | Admitting: Hematology and Oncology

## 2020-09-08 ENCOUNTER — Encounter: Payer: Self-pay | Admitting: Hematology and Oncology

## 2020-09-08 ENCOUNTER — Telehealth: Payer: Self-pay | Admitting: Hematology and Oncology

## 2020-09-08 ENCOUNTER — Inpatient Hospital Stay: Payer: BC Managed Care – PPO

## 2020-09-08 VITALS — BP 133/63 | HR 79 | Temp 96.4°F | Resp 18 | Ht 74.5 in | Wt 172.7 lb

## 2020-09-08 DIAGNOSIS — I82432 Acute embolism and thrombosis of left popliteal vein: Secondary | ICD-10-CM

## 2020-09-08 DIAGNOSIS — Z7901 Long term (current) use of anticoagulants: Secondary | ICD-10-CM | POA: Insufficient documentation

## 2020-09-08 LAB — CBC WITH DIFFERENTIAL/PLATELET
Abs Immature Granulocytes: 0 10*3/uL (ref 0.00–0.07)
Basophils Absolute: 0 10*3/uL (ref 0.0–0.1)
Basophils Relative: 1 %
Eosinophils Absolute: 0.1 10*3/uL (ref 0.0–0.5)
Eosinophils Relative: 3 %
HCT: 37.2 % — ABNORMAL LOW (ref 39.0–52.0)
Hemoglobin: 12.6 g/dL — ABNORMAL LOW (ref 13.0–17.0)
Immature Granulocytes: 0 %
Lymphocytes Relative: 46 %
Lymphs Abs: 1.7 10*3/uL (ref 0.7–4.0)
MCH: 28.5 pg (ref 26.0–34.0)
MCHC: 33.9 g/dL (ref 30.0–36.0)
MCV: 84.2 fL (ref 80.0–100.0)
Monocytes Absolute: 0.3 10*3/uL (ref 0.1–1.0)
Monocytes Relative: 8 %
Neutro Abs: 1.5 10*3/uL — ABNORMAL LOW (ref 1.7–7.7)
Neutrophils Relative %: 42 %
Platelets: 241 10*3/uL (ref 150–400)
RBC: 4.42 MIL/uL (ref 4.22–5.81)
RDW: 11.8 % (ref 11.5–15.5)
WBC: 3.7 10*3/uL — ABNORMAL LOW (ref 4.0–10.5)
nRBC: 0 % (ref 0.0–0.2)

## 2020-09-08 LAB — CMP (CANCER CENTER ONLY)
ALT: 30 U/L (ref 0–44)
AST: 24 U/L (ref 15–41)
Albumin: 4.6 g/dL (ref 3.5–5.0)
Alkaline Phosphatase: 97 U/L (ref 38–126)
Anion gap: 7 (ref 5–15)
BUN: 18 mg/dL (ref 6–20)
CO2: 27 mmol/L (ref 22–32)
Calcium: 9.6 mg/dL (ref 8.9–10.3)
Chloride: 106 mmol/L (ref 98–111)
Creatinine: 0.99 mg/dL (ref 0.61–1.24)
GFR, Estimated: >60 mL/min (ref >60)
Glucose, Bld: 87 mg/dL (ref 70–99)
Potassium: 4.2 mmol/L (ref 3.5–5.1)
Sodium: 140 mmol/L (ref 135–145)
Total Bilirubin: 0.3 mg/dL (ref 0.3–1.2)
Total Protein: 8 g/dL (ref 6.5–8.1)

## 2020-09-08 LAB — ANTITHROMBIN III: AntiThromb III Func: 106 % (ref 75–120)

## 2020-09-08 NOTE — Telephone Encounter (Signed)
Scheduled appointment per 2/10 los. Spoke to patient who is aware of appointment date and time.  

## 2020-09-08 NOTE — Progress Notes (Signed)
[facility] CONSULT NOTE  Patient Care Team: Patient, No Pcp Per as PCP - General (General Practice)  CHIEF COMPLAINTS/PURPOSE OF CONSULTATION:  DVT  ASSESSMENT & PLAN:  No problem-specific Assessment & Plan notes found for this encounter.  No orders of the defined types were placed in this encounter.  1. DVT left lower extremity, provoked. He is on anticoagulation with Xarelto Recommend a minimum of 3 months. I recommended hypercoagulable work up since he is very young at the time of his DVT. He and mom are agreeable. Mom was wondering if we can repeat an [place] to review the status of DVT I think this is reasonable to consider before we discontinue anticoagulation. I ordered this today He will RTC before the end of March to discuss duration of anticoagulation. He was recommended to continue anticoagulation for a minimum total duration of 3 months. He understands that sometimes it takes longer to clear the clot.  HISTORY OF PRESENTING ILLNESS:  Blake Perry 20 y.o. male is here because of left popliteal vein DVT. [name] is here for an initial visit with his mom. He had an ACL tear during a foot ball play and had repair done in Dec 2021. Soon after he started noticing a swelling in his left leg with some cramping and hence was seen in the ED there. There are some records which suggest a LLE DVT of the popliteal vein but I dont have an actual [place] report.  He says he is feeling well, taking the blood thinner as prescribed. He denies any chest pain, SOB today. He denies any bleeding complaints with the blood thinner. No prior DVT/PE No known family history of hypercoagulable disorders although dad's side of the history is not clear. Rest of the pertinent 10 point ROS reviewed and negative.  REVIEW OF SYSTEMS:   Constitutional: Denies fevers, chills or abnormal night sweats Eyes: Denies blurriness of vision, double vision or watery eyes Ears, nose, mouth, throat, and  face: Denies mucositis or sore throat Respiratory: Denies cough, dyspnea or wheezes Cardiovascular: Denies palpitation, chest discomfort or lower extremity swelling Gastrointestinal:  Denies nausea, heartburn or change in bowel habits Skin: Denies abnormal skin rashes Lymphatics: Denies new lymphadenopathy or easy bruising Neurological:Denies numbness, tingling or new weaknesses Behavioral/Psych: Mood is stable, no new changes  All other systems were reviewed with the patient and are negative.  MEDICAL HISTORY:  Past Medical History:  Diagnosis Date  . Acute deep vein thrombosis (DVT) of left lower extremity after procedure (HCC)   . Asthma   . Extra digits     SURGICAL HISTORY: Past Surgical History:  Procedure Laterality Date  . ARTHROSCOPIC REPAIR ACL    . INCISION AND DRAINAGE Left 08/25/2020   Procedure: INCISION AND DRAINAGE LEG DEEP ABSCESS/HEMATOMA;  Surgeon: Bjorn Pippin, MD;  Location: [facility] OR;  Service: Orthopedics;  Laterality: Left;  . KNEE ARTHROSCOPY Left 08/25/2020   Procedure: ARTHROSCOPY LEFT KNEE FOR INFECTION A LAVAGE AND DRAINAGE;  Surgeon: Bjorn Pippin, MD;  Location: [facility] OR;  Service: Orthopedics;  Laterality: Left;  . URETHRA SURGERY      SOCIAL HISTORY: Social History   Socioeconomic History  . Marital status: Single    Spouse name: Not on file  . Number of children: Not on file  . Years of education: Not on file  . Highest education level: Not on file  Occupational History  . Not on file  Tobacco Use  . Smoking status: Never Smoker  .  Smokeless tobacco: Never Used  Vaping Use  . Vaping Use: Former  Substance and Sexual Activity  . Alcohol use: Yes    Alcohol/week: 1.0 standard drink    Types: 1 Cans of beer per week    Comment: beer occasionally  . Drug use: Yes    Types: Marijuana  . Sexual activity: Never  Other Topics Concern  . Not on file  Social History Narrative  . Not on file   Social Determinants of Health   Financial  Resource Strain: Not on file  Food Insecurity: Not on file  Transportation Needs: Not on file  Physical Activity: Not on file  Stress: Not on file  Social Connections: Not on file  Intimate Partner Violence: Not on file    FAMILY HISTORY: History reviewed. No pertinent family history.  ALLERGIES:  is allergic to bee venom.  MEDICATIONS:  Current Outpatient Medications  Medication Sig Dispense Refill  . acetaminophen (TYLENOL) 500 MG tablet Take 2 tablets (1,000 mg total) by mouth every 8 (eight) hours for 14 days. 84 tablet 0  . ibuprofen (ADVIL,MOTRIN) 600 MG tablet Take 1 tab PO Q6h x 1-2 days then Q6h prn (Patient taking differently: Take by mouth every 6 (six) hours as needed for mild pain or moderate pain.) 30 tablet 0  . rivaroxaban (XARELTO) 20 MG TABS tablet Take 1 tablet (20 mg total) by mouth daily with supper. 30 tablet 0  . sulfamethoxazole-trimethoprim (BACTRIM DS) 800-160 MG tablet Take 1 tablet by mouth 2 (two) times daily for 14 days. 28 tablet 0   No current facility-administered medications for this visit.    PHYSICAL EXAMINATION:  ECOG PERFORMANCE STATUS: 0 - Asymptomatic  Vitals:   09/08/20 1320  BP: 133/63  Pulse: 79  Resp: 18  Temp: (!) 96.4 F (35.8 C)  SpO2: 100%   Filed Weights   09/08/20 1320  Weight: 172 lb 11.2 oz (78.3 kg)    GENERAL:alert, no distress and comfortable SKIN: skin color, texture, turgor are normal, no rashes or significant lesions EYES: normal, conjunctiva are pink and non-injected, sclera clear OROPHARYNX:no exudate, no erythema and lips, buccal mucosa, and tongue normal  NECK: supple, thyroid normal size, non-tender, without nodularity LYMPH:  no palpable lymphadenopathy in the cervical, axillary or inguinal LUNGS: clear to auscultation and percussion with normal breathing effort HEART: regular rate & rhythm and no murmurs and no lower extremity edema ABDOMEN:abdomen soft, non-tender and normal bowel  sounds Musculoskeletal:no cyanosis of digits and no clubbing  PSYCH: alert & oriented x 3 with fluent speech NEURO: no focal motor/sensory deficits  LABORATORY DATA:  I have reviewed the data as listed Lab Results  Component Value Date   WBC 1.6 (L) 08/25/2020   HGB 14.3 08/25/2020   HCT 42.0 08/25/2020   MCV 100.6 (H) 08/25/2020   PLT 64 (L) 08/25/2020     Chemistry      Component Value Date/Time   NA 139 08/25/2020 1353   K 4.1 08/25/2020 1353   CL 98 08/25/2020 1353   BUN 9 08/25/2020 1353   CREATININE 0.80 08/25/2020 1353   No results found for: CALCIUM, ALKPHOS, AST, ALT, BILITOT     RADIOGRAPHIC STUDIES: I have personally reviewed the radiological images as listed and agreed with the findings in the report. No results found.   Reviewed pertinent medical records.  All questions were answered. The patient knows to call the clinic with any problems, questions or concerns. I spent 35 minutes in the care of  this patient including H and P, review of records, counseling and coordination of care.     Rachel Moulds, MD 09/08/2020 1:37 PM

## 2020-09-08 NOTE — Telephone Encounter (Signed)
Per Dr. Al Pimple, the patient's Mother has requested that the Ultrasound be faxed to [facility]. The Ultrasound requisition was faxed to [facility] today at (f) [phone].

## 2020-09-09 LAB — PROTEIN C ACTIVITY: Protein C Activity: 119 % (ref 73–180)

## 2020-09-09 LAB — PROTEIN S ACTIVITY: Protein S Activity: 108 % (ref 63–140)

## 2020-09-10 LAB — CARDIOLIPIN ANTIBODIES, IGG, IGM, IGA
Anticardiolipin IgA: <9 APL U/mL (ref 0–11)
Anticardiolipin IgG: <9 GPL U/mL (ref 0–14)
Anticardiolipin IgM: 9 MPL U/mL (ref 0–12)

## 2020-09-10 LAB — LUPUS ANTICOAGULANT PANEL
DRVVT: 57 s — ABNORMAL HIGH (ref 0.0–47.0)
PTT Lupus Anticoagulant: 42.5 s (ref 0.0–51.9)

## 2020-09-10 LAB — BETA-2-GLYCOPROTEIN I ABS, IGG/M/A
Beta-2 Glyco I IgG: <9 GPI IgG units (ref 0–20)
Beta-2-Glycoprotein I IgA: <9 GPI IgA units (ref 0–25)
Beta-2-Glycoprotein I IgM: <9 GPI IgM units (ref 0–32)

## 2020-09-10 LAB — DRVVT CONFIRM: dRVVT Confirm: 1.4 ratio — ABNORMAL HIGH (ref 0.8–1.2)

## 2020-09-10 LAB — DRVVT MIX: dRVVT Mix: 45.6 s — ABNORMAL HIGH (ref 0.0–40.4)

## 2020-09-13 LAB — HEXAGONAL PHOSPHOLIPID NEUTRALIZATION: Hexagonal Phospholipid Neutral: 0 s

## 2020-09-13 LAB — FACTOR 5 LEIDEN

## 2020-09-15 LAB — PROTHROMBIN GENE MUTATION

## 2020-10-04 ENCOUNTER — Telehealth: Payer: Self-pay

## 2020-10-04 NOTE — Telephone Encounter (Signed)
Received phone call from [place], a [place] at [facility] asking our office would be able to refill patient's Xarelto prescription as he is being seen by this office for DVT. Message forwarded to Dr. Al Pimple for advisement.

## 2020-10-05 ENCOUNTER — Other Ambulatory Visit: Payer: Self-pay

## 2020-10-05 MED ORDER — RIVAROXABAN 20 MG PO TABS: 20.0000 mg | ORAL_TABLET | Freq: Every day | ORAL | 0 refills | Status: DC

## 2020-10-06 ENCOUNTER — Telehealth: Payer: Self-pay

## 2020-10-06 NOTE — Telephone Encounter (Signed)
I tried contacting the Patient's mother today to inquire about his Ultrasound that he was supposed to have completed. When the patient was seen in February we faxed the Ultrasound orders over to [facility] per the Mother's request. Awaiting return call from the Mother to follow up.

## 2020-10-07 ENCOUNTER — Telehealth: Payer: Self-pay

## 2020-10-07 ENCOUNTER — Other Ambulatory Visit: Payer: Self-pay | Admitting: Hematology and Oncology

## 2020-10-07 NOTE — Telephone Encounter (Signed)
I spoke with the patient's Mother Ms. Blake Perry today to follow up regarding the ultrasound that the patient needed to have completed. She confirmed that she got my voicemail yesterday and followed up with the company that we had originally faxed the ultrasound to. She states that the company denied the ability to preform ultrasound's and requested that I now fax the ultrasound over to [facility] where we had originally intended on faxing it to. I faxed the [place] requisition to [facility] with receipt of confirmation.  (f) [phone]

## 2020-10-26 ENCOUNTER — Inpatient Hospital Stay: Payer: BC Managed Care – PPO | Attending: Hematology and Oncology | Admitting: Hematology and Oncology

## 2020-10-26 ENCOUNTER — Telehealth: Payer: Self-pay

## 2020-10-26 NOTE — Telephone Encounter (Signed)
Attempted to call patient and patient's mother. Unable to leave voicemail as patient does not have a voicemail set-up and patient's mother no longer has number listed under chart. Dr. Al Pimple aware.

## 2021-04-27 ENCOUNTER — Ambulatory Visit (INDEPENDENT_AMBULATORY_CARE_PROVIDER_SITE_OTHER): Payer: BC Managed Care – PPO

## 2021-04-27 ENCOUNTER — Other Ambulatory Visit: Payer: Self-pay

## 2021-04-27 ENCOUNTER — Ambulatory Visit (HOSPITAL_COMMUNITY)
Admission: EM | Admit: 2021-04-27 | Discharge: 2021-04-27 | Disposition: A | Discharge status: Home / Self Care | Payer: BC Managed Care – PPO | Attending: Student | Admitting: Student

## 2021-04-27 ENCOUNTER — Encounter (HOSPITAL_COMMUNITY): Payer: Self-pay | Admitting: Emergency Medicine

## 2021-04-27 DIAGNOSIS — M25512 Pain in left shoulder: Secondary | ICD-10-CM | POA: Diagnosis not present

## 2021-04-27 DIAGNOSIS — S46912A Strain of unspecified muscle, fascia and tendon at shoulder and upper arm level, left arm, initial encounter: Secondary | ICD-10-CM | POA: Diagnosis not present

## 2021-04-27 MED ORDER — TIZANIDINE HCL 2 MG PO TABS: 2.0000 mg | ORAL_TABLET | Freq: Three times a day (TID) | ORAL | 0 refills | Status: DC | PRN

## 2021-04-27 MED ORDER — KETOROLAC TROMETHAMINE 10 MG PO TABS: 10.0000 mg | ORAL_TABLET | Freq: Four times a day (QID) | ORAL | 0 refills | Status: DC | PRN

## 2021-04-27 NOTE — ED Triage Notes (Signed)
Pt reports in shower and when frantically trying to get away from a mouse ran hit shoulder on side of shower. Reports had an old football injury on that side. C/o pain localized in shoulder.

## 2021-04-27 NOTE — ED Provider Notes (Signed)
[facility]    CSN: [id number] Arrival date & time: 04/27/21  2703      History   Chief Complaint Chief Complaint  Patient presents with   Shoulder Pain    HPI Blake Perry is a 20 y.o. male presenting with left shoulder pain for 1 day following running into a wall.  Medical history ACL repair, asthma, DVT.  Was on anticoagulation for 3 months following DVT, has been off this for about 3 months now.  Also with history of multiple left shoulder injuries in the past, states that he has never been seen by doctor for this though.  States that when playing football his shoulder would pop out of joint and then would pop back in on its own.  States his current symptoms began after he saw a mouse while he was in the shower and started running away from it, ran into the corner of a wall directly onto the shoulder.  Now with pain anterior and posterior, worse with movement.  He is able to raise the arm, but it hurts a lot.  Denies sensation changes, neck pain, pain radiating down the arm.  He is right-handed.  Symptoms are somewhat relieved with Tylenol.  HPI  Past Medical History:  Diagnosis Date   Acute deep vein thrombosis (DVT) of left lower extremity after procedure (HCC)    Asthma    Extra digits     There are no problems to display for this patient.   Past Surgical History:  Procedure Laterality Date   ARTHROSCOPIC REPAIR ACL     INCISION AND DRAINAGE Left 08/25/2020   Procedure: INCISION AND DRAINAGE LEG DEEP ABSCESS/HEMATOMA;  Surgeon: Bjorn Pippin, MD;  Location: [facility] OR;  Service: Orthopedics;  Laterality: Left;   KNEE ARTHROSCOPY Left 08/25/2020   Procedure: ARTHROSCOPY LEFT KNEE FOR INFECTION A LAVAGE AND DRAINAGE;  Surgeon: Bjorn Pippin, MD;  Location: [facility] OR;  Service: Orthopedics;  Laterality: Left;   URETHRA SURGERY         Home Medications    Prior to Admission medications   Medication Sig Start Date End Date Taking? Authorizing Provider  ketorolac  (TORADOL) 10 MG tablet Take 1 tablet (10 mg total) by mouth every 6 (six) hours as needed. 04/27/21  Yes Rhys Martini, PA-C  tiZANidine (ZANAFLEX) 2 MG tablet Take 1 tablet (2 mg total) by mouth every 8 (eight) hours as needed for muscle spasms. 04/27/21  Yes Rhys Martini, PA-C    Family History No family history on file.  Social History Social History   Tobacco Use   Smoking status: Never   Smokeless tobacco: Never  Vaping Use   Vaping Use: Former  Substance Use Topics   Alcohol use: Yes    Alcohol/week: 1.0 standard drink    Types: 1 Cans of beer per week    Comment: beer occasionally   Drug use: Yes    Types: Marijuana     Allergies   Bee venom   Review of Systems Review of Systems  Musculoskeletal:        L shoulder pain  All other systems reviewed and are negative.   Physical Exam Triage Vital Signs ED Triage Vitals  Enc Vitals Group     BP 04/27/21 0853 116/62     Pulse Rate 04/27/21 0853 75     Resp 04/27/21 0853 15     Temp 04/27/21 0853 98.5 F (36.9 C)     Temp Source 04/27/21  0853 Oral     SpO2 04/27/21 0853 98 %     Weight --      Height --      Head Circumference --      Peak Flow --      Pain Score 04/27/21 0852 6     Pain Loc --      Pain Edu? --      Excl. in GC? --    No data found.  Updated Vital Signs BP 116/62 (BP Location: Right Arm)   Pulse 75   Temp 98.5 F (36.9 C) (Oral)   Resp 15   SpO2 98%   Visual Acuity Right Eye Distance:   Left Eye Distance:   Bilateral Distance:    Right Eye Near:   Left Eye Near:    Bilateral Near:     Physical Exam Vitals reviewed.  Constitutional:      General: He is not in acute distress.    Appearance: Normal appearance. He is not ill-appearing or diaphoretic.  HENT:     Head: Normocephalic and atraumatic.  Cardiovascular:     Rate and Rhythm: Normal rate and regular rhythm.     Heart sounds: Normal heart sounds.  Pulmonary:     Effort: Pulmonary effort is normal.      Breath sounds: Normal breath sounds.  Musculoskeletal:     Comments: L shoulder- TTP anteriorly and posteriorly, without obvious bony deformity effusion or skin changes. No AC joint tenderness, no clavicular or scapular tenderness. ROM abduction and adduction intact but with pain. Strength intact but with pain. Negative empty beer. Grip strength 5/5, sensation intact, cap refill <2 seconds.  Skin:    General: Skin is warm.  Neurological:     General: No focal deficit present.     Mental Status: He is alert and oriented to person, place, and time.  Psychiatric:        Mood and Affect: Mood normal.        Behavior: Behavior normal.        Thought Content: Thought content normal.        Judgment: Judgment normal.     UC Treatments / Results  Labs (all labs ordered are listed, but only abnormal results are displayed) Labs Reviewed - No data to display  EKG   Radiology DG Shoulder Left  Result Date: 04/27/2021 CLINICAL DATA:  20 year old male status post blunt trauma yesterday with recurrent shoulder pain. History of prior dislocation. EXAM: LEFT SHOULDER - 2+ VIEW COMPARISON:  None. FINDINGS: No glenohumeral joint dislocation. There is no evidence of fracture or dislocation. There is no evidence of arthropathy or other focal bone abnormality. Negative visible chest. IMPRESSION: Negative. Electronically Signed   By: Odessa Fleming M.D.   On: 04/27/2021 09:52    Procedures Procedures (including critical care time)  Medications Ordered in UC Medications - No data to display  Initial Impression / Assessment and Plan / UC Course  I have reviewed the triage vital signs and the nursing notes.  Pertinent labs & imaging results that were available during my care of the patient were reviewed by me and considered in my medical decision making (see chart for details).     This patient is a very pleasant 20 y.o. year old male presenting with L shoulder pain; exacerbation of chronic injury.  Neurovascularly intact. Xray L shoulder negative today.  Sling, Toradol, Zanaflex.  He is no longer on anticoagulation for history of DVT.  Follow-up with Ortho  as this is a chronic issue, information provided ED return precautions discussed. Patient verbalizes understanding and agreement.  .   Final Clinical Impressions(s) / UC Diagnoses   Final diagnoses:  Strain of left shoulder, initial encounter     Discharge Instructions      -Start the muscle relaxer-Zanaflex (tizanidine), up to 3 times daily for muscle spasms and pain.  This can make you drowsy, so take at bedtime or when you do not need to drive or operate machinery. -Toradol (Ketorolac), one pill up to every 6 hours for pain. Make sure to take this with food. Avoid other NSAIDs like ibuprofen, alleve, naproxen while taking this medication.  -You can also take Tylenol 1000mg  up to 3x daily -Rest, heating pad, etc -If symptoms persist in 3-5 days, follow-up with an orthopedist. I recommend [facility] at [address] [place], [place]. You can schedule an appointment by calling [phone]) or online (([phone]), but they also have a walk-in clinic M-F 8a-8p and Sat 10a-3p.    ED Prescriptions     Medication Sig Dispense Auth. Provider   ketorolac (TORADOL) 10 MG tablet Take 1 tablet (10 mg total) by mouth every 6 (six) hours as needed. 20 tablet [email or web address]/, PA-C   tiZANidine (ZANAFLEX) 2 MG tablet Take 1 tablet (2 mg total) by mouth every 8 (eight) hours as needed for muscle spasms. 21 tablet Rhys Martini, PA-C      PDMP not reviewed this encounter.   Rhys Martini, PA-C 04/27/21 1013

## 2021-04-27 NOTE — Discharge Instructions (Signed)
-  Start the muscle relaxer-Zanaflex (tizanidine), up to 3 times daily for muscle spasms and pain.  This can make you drowsy, so take at bedtime or when you do not need to drive or operate machinery. -Toradol (Ketorolac), one pill up to every 6 hours for pain. Make sure to take this with food. Avoid other NSAIDs like ibuprofen, alleve, naproxen while taking this medication.  -You can also take Tylenol 1000mg  up to 3x daily -Rest, heating pad, etc -If symptoms persist in 3-5 days, follow-up with an orthopedist. I recommend [facility] at [address], [place], [place]. You can schedule an appointment by calling [phone]) or online [phone]), but they also have a walk-in clinic M-F 8a-8p and Sat 10a-3p.

## 2021-12-01 IMAGING — DX DG SHOULDER 2+V*L*
2 series · 2 of 2 positions shown · non-contrast
Comparison: None.

CLINICAL DATA: 20-year-old male status post blunt trauma yesterday
with recurrent shoulder pain. History of prior dislocation.

EXAM:
LEFT SHOULDER - 2+ VIEW

[shoulder grashey]
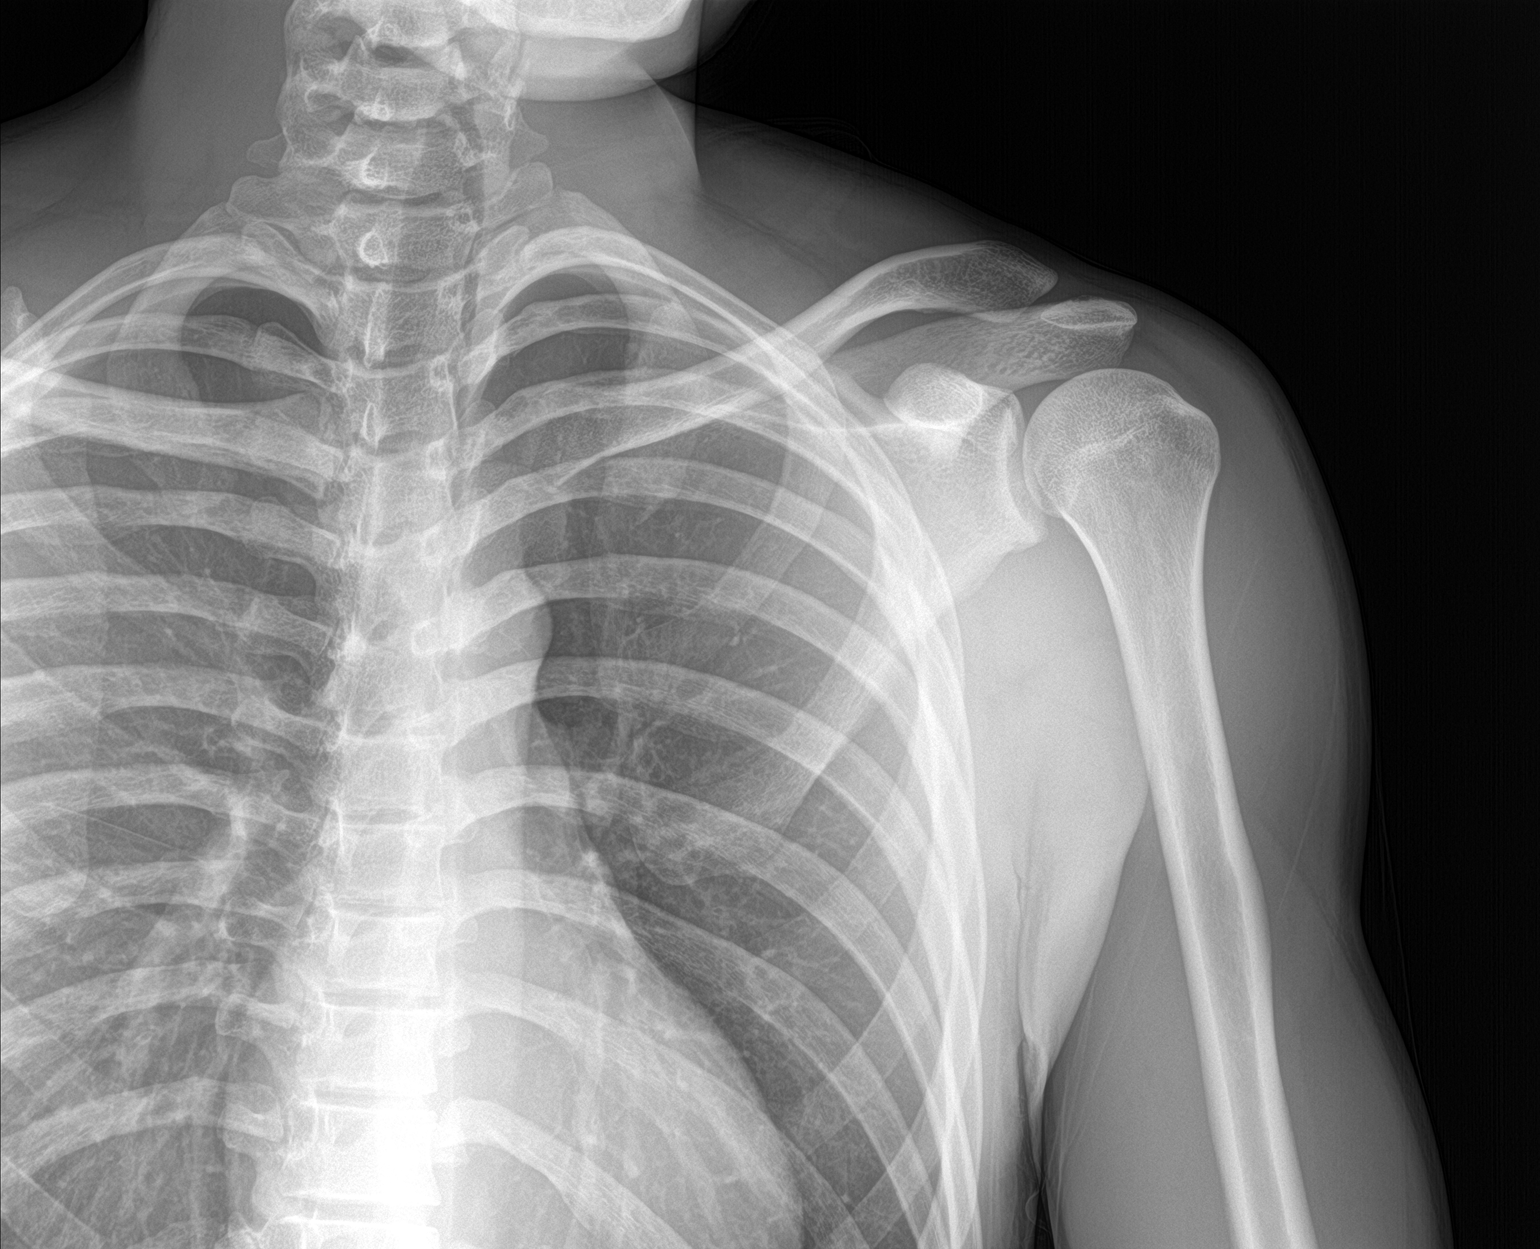

[shoulder y-view]
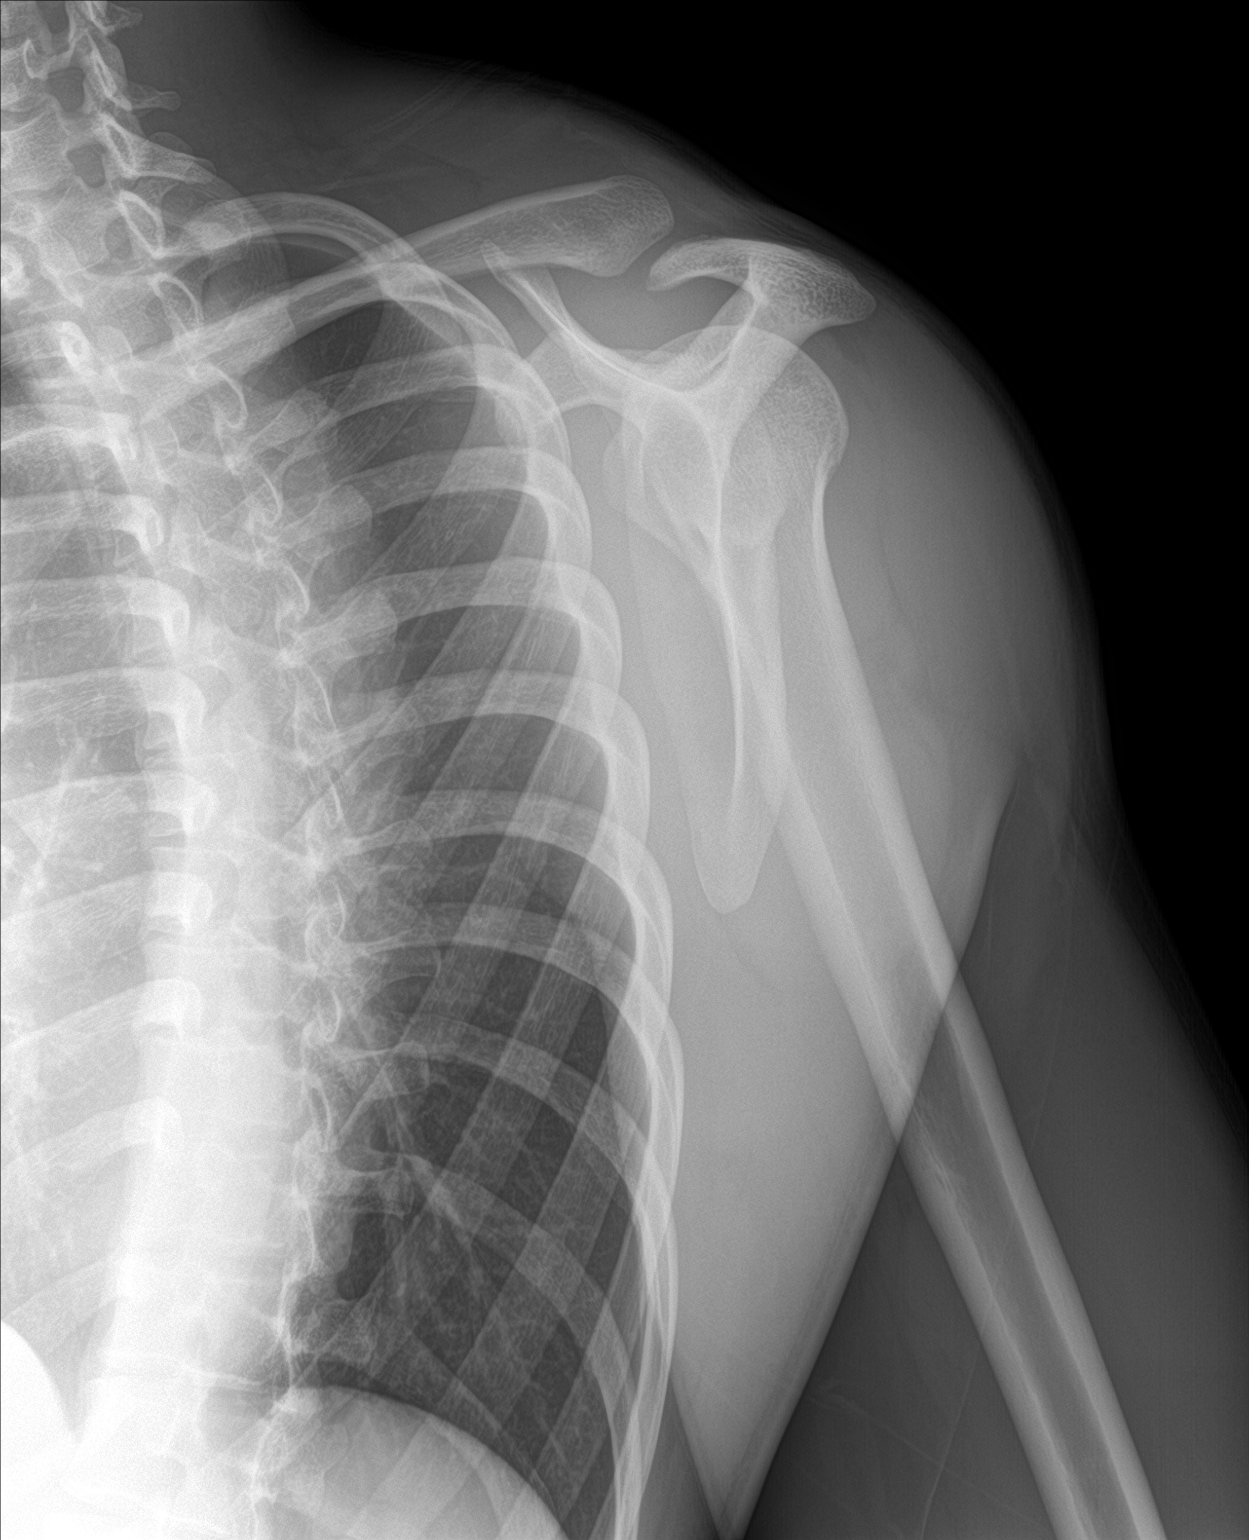

[2 of 2 positions shown; findings below may reference images not displayed]

FINDINGS: No glenohumeral joint dislocation. There is no evidence of fracture
or dislocation. There is no evidence of arthropathy or other focal
bone abnormality. Negative visible chest.
IMPRESSION: Negative.

## 2022-08-30 ENCOUNTER — Emergency Department (HOSPITAL_COMMUNITY): Payer: BC Managed Care – PPO

## 2022-08-30 ENCOUNTER — Other Ambulatory Visit: Payer: Self-pay

## 2022-08-30 ENCOUNTER — Emergency Department (HOSPITAL_COMMUNITY)
Admission: EM | Admit: 2022-08-30 | Discharge: 2022-08-30 | Disposition: A | Discharge status: Home / Self Care | Payer: BC Managed Care – PPO | Attending: Emergency Medicine | Admitting: Emergency Medicine

## 2022-08-30 DIAGNOSIS — J45909 Unspecified asthma, uncomplicated: Secondary | ICD-10-CM | POA: Insufficient documentation

## 2022-08-30 DIAGNOSIS — R1032 Left lower quadrant pain: Secondary | ICD-10-CM | POA: Diagnosis present

## 2022-08-30 DIAGNOSIS — R591 Generalized enlarged lymph nodes: Secondary | ICD-10-CM | POA: Insufficient documentation

## 2022-08-30 DIAGNOSIS — R109 Unspecified abdominal pain: Secondary | ICD-10-CM

## 2022-08-30 LAB — CBC WITH DIFFERENTIAL/PLATELET
Abs Immature Granulocytes: 0.01 10*3/uL (ref 0.00–0.07)
Basophils Absolute: 0 10*3/uL (ref 0.0–0.1)
Basophils Relative: 0 %
Eosinophils Absolute: 0.1 10*3/uL (ref 0.0–0.5)
Eosinophils Relative: 2 %
HCT: 39 % (ref 39.0–52.0)
Hemoglobin: 13.3 g/dL (ref 13.0–17.0)
Immature Granulocytes: 0 %
Lymphocytes Relative: 36 %
Lymphs Abs: 2.4 10*3/uL (ref 0.7–4.0)
MCH: 29.6 pg (ref 26.0–34.0)
MCHC: 34.1 g/dL (ref 30.0–36.0)
MCV: 86.7 fL (ref 80.0–100.0)
Monocytes Absolute: 0.6 10*3/uL (ref 0.1–1.0)
Monocytes Relative: 10 %
Neutro Abs: 3.5 10*3/uL (ref 1.7–7.7)
Neutrophils Relative %: 52 %
Platelets: 190 10*3/uL (ref 150–400)
RBC: 4.5 MIL/uL (ref 4.22–5.81)
RDW: 11.9 % (ref 11.5–15.5)
WBC: 6.8 10*3/uL (ref 4.0–10.5)
nRBC: 0 % (ref 0.0–0.2)

## 2022-08-30 LAB — COMPREHENSIVE METABOLIC PANEL
ALT: 17 U/L (ref 0–44)
AST: 21 U/L (ref 15–41)
Albumin: 3.9 g/dL (ref 3.5–5.0)
Alkaline Phosphatase: 69 U/L (ref 38–126)
Anion gap: 10 (ref 5–15)
BUN: 19 mg/dL (ref 6–20)
CO2: 25 mmol/L (ref 22–32)
Calcium: 9.4 mg/dL (ref 8.9–10.3)
Chloride: 103 mmol/L (ref 98–111)
Creatinine, Ser: 1.07 mg/dL (ref 0.61–1.24)
GFR, Estimated: >60 mL/min (ref >60)
Glucose, Bld: 109 mg/dL — ABNORMAL HIGH (ref 70–99)
Potassium: 3.9 mmol/L (ref 3.5–5.1)
Sodium: 138 mmol/L (ref 135–145)
Total Bilirubin: 0.3 mg/dL (ref 0.3–1.2)
Total Protein: 7.2 g/dL (ref 6.5–8.1)

## 2022-08-30 LAB — GC/CHLAMYDIA PROBE AMP (~~LOC~~) NOT AT ARMC
Chlamydia: NEGATIVE
Comment: NEGATIVE
Comment: NORMAL
Neisseria Gonorrhea: NEGATIVE

## 2022-08-30 LAB — URINALYSIS, ROUTINE W REFLEX MICROSCOPIC
Bilirubin Urine: NEGATIVE
Glucose, UA: NEGATIVE mg/dL
Hgb urine dipstick: NEGATIVE
Ketones, ur: NEGATIVE mg/dL
Leukocytes,Ua: NEGATIVE
Nitrite: NEGATIVE
Protein, ur: NEGATIVE mg/dL
Specific Gravity, Urine: 1.023 (ref 1.005–1.030)
pH: 6 (ref 5.0–8.0)

## 2022-08-30 MED ORDER — CEFTRIAXONE SODIUM 500 MG IJ SOLR
500.0000 mg | Freq: Once | INTRAMUSCULAR | Status: AC
  Administered 2022-08-30: 500 mg via INTRAMUSCULAR
  Filled 2022-08-30: qty 500

## 2022-08-30 MED ORDER — LIDOCAINE HCL (PF) 1 % IJ SOLN
INTRAMUSCULAR | Status: AC
  Filled 2022-08-30: qty 5

## 2022-08-30 MED ORDER — NAPROXEN 500 MG PO TABS: 500.0000 mg | ORAL_TABLET | Freq: Two times a day (BID) | ORAL | 0 refills | Status: DC

## 2022-08-30 MED ORDER — DOXYCYCLINE HYCLATE 100 MG PO CAPS: 100.0000 mg | ORAL_CAPSULE | Freq: Two times a day (BID) | ORAL | 0 refills | Status: AC

## 2022-08-30 MED ORDER — HYDROMORPHONE HCL 1 MG/ML IJ SOLN
1.0000 mg | Freq: Once | INTRAMUSCULAR | Status: AC
  Administered 2022-08-30: 1 mg via INTRAMUSCULAR
  Filled 2022-08-30: qty 1

## 2022-08-30 NOTE — ED Triage Notes (Signed)
Pt c/o mid lower pain ongoing for 2 days with pain in left testicle today. No NVD. NO urinary s/s.

## 2022-08-30 NOTE — ED Provider Triage Note (Signed)
Emergency Medicine Provider Triage Evaluation Note  Blake Perry , a 22 y.o. male  was evaluated in triage.  Pt complains of LLQ abdominal pain for the past 2 days.  States yesterday started shooting to left testicle.  States testicle does not appears swollen but feels "weird".  Denies dysuria, hematuria, flank pain, or penile discharge.  No hx of similar in the past.  Review of Systems  Positive: Abdominal pain, testicle pain Negative: fever  Physical Exam  BP 135/77 (BP Location: Right Arm)   Pulse 92   Temp 98 F (36.7 C)   Resp 17   SpO2 98%  Gen:   Awake, no distress   Resp:  Normal effort  MSK:   Moves extremities without difficulty  Other:  Chaperoned by triage RN, seems to have discomfort with left testicle exam, no noted swelling, no discharge   Medical Decision Making  Medically screening exam initiated at 3:52 AM.  Appropriate orders placed.  [place] A Letts was informed that the remainder of the evaluation will be completed by another provider, this initial triage assessment does not replace that evaluation, and the importance of remaining in the ED until their evaluation is complete.  Abdominal pain, now having left testicle pain.  No swelling/discharge noted on exam.  Denies urinary symptoms.  Will check labs, UA, scrotal US w/doppler.   Larene Pickett, PA-C 08/30/22 [phone]

## 2022-08-30 NOTE — ED Provider Notes (Signed)
[facility] Emergency Department Provider Note MRN:  [id number]  Arrival date & time: 08/30/22     Chief Complaint   Abdominal Pain   History of Present Illness   Blake Perry is a 22 y.o. year-old male with a history of DVT, asthma presenting to the ED with chief complaint of abdominal pain.  Left lower quadrant abdominal pain for the past 2 days.  Occasional radiation to the left testicle.  No dysuria or hematuria, no fever, no nausea vomiting or diarrhea.  Review of Systems  A thorough review of systems was obtained and all systems are negative except as noted in the HPI and PMH.   Patient's Health History    Past Medical History:  Diagnosis Date   Acute deep vein thrombosis (DVT) of left lower extremity after procedure (HCC)    Asthma    Extra digits     Past Surgical History:  Procedure Laterality Date   ARTHROSCOPIC REPAIR ACL     INCISION AND DRAINAGE Left 08/25/2020   Procedure: INCISION AND DRAINAGE LEG DEEP ABSCESS/HEMATOMA;  Surgeon: Hiram Gash, MD;  Location: [place];  Service: Orthopedics;  Laterality: Left;   KNEE ARTHROSCOPY Left 08/25/2020   Procedure: ARTHROSCOPY LEFT KNEE FOR INFECTION A LAVAGE AND DRAINAGE;  Surgeon: Hiram Gash, MD;  Location: [place];  Service: Orthopedics;  Laterality: Left;   URETHRA SURGERY      No family history on file.  Social History   Socioeconomic History   Marital status: Single    Spouse name: Not on file   Number of children: Not on file   Years of education: Not on file   Highest education level: Not on file  Occupational History   Not on file  Tobacco Use   Smoking status: Never   Smokeless tobacco: Never  Vaping Use   Vaping Use: Former  Substance and Sexual Activity   Alcohol use: Yes    Alcohol/week: 1.0 standard drink of alcohol    Types: 1 Cans of beer per week    Comment: beer occasionally   Drug use: Yes    Types: Marijuana   Sexual activity: Never  Other Topics Concern    Not on file  Social History Narrative   Not on file   Social Determinants of Health   Financial Resource Strain: Not on file  Food Insecurity: Not on file  Transportation Needs: Not on file  Physical Activity: Not on file  Stress: Not on file  Social Connections: Not on file  Intimate Partner Violence: Not on file     Physical Exam   Vitals:   08/30/22 0515 08/30/22 0615  BP: (!) 148/84 (!) 142/84  Pulse: 95 82  Resp: 12 13  Temp:    SpO2: 99% 98%    CONSTITUTIONAL: Well-appearing, NAD NEURO/PSYCH:  Alert and oriented x 3, no focal deficits EYES:  eyes equal and reactive ENT/NECK:  no LAD, no JVD CARDIO: Regular rate, well-perfused, normal S1 and S2 PULM:  CTAB no wheezing or rhonchi GI/GU:  non-distended, non-tender MSK/SPINE:  No gross deformities, no edema SKIN:  no rash, atraumatic   *Additional and/or pertinent findings included in MDM below  Diagnostic and Interventional Summary    EKG Interpretation  Date/Time:    Ventricular Rate:    PR Interval:    QRS Duration:   QT Interval:    QTC Calculation:   R Axis:     Text Interpretation:  Labs Reviewed  COMPREHENSIVE METABOLIC PANEL - Abnormal; Notable for the following components:      Result Value   Glucose, Bld 109 (*)    All other components within normal limits  CBC WITH DIFFERENTIAL/PLATELET  URINALYSIS, ROUTINE W REFLEX MICROSCOPIC  GC/CHLAMYDIA PROBE AMP ([place]) NOT AT [facility]    CT Renal Stone Study  Final Result    US SCROTUM W/DOPPLER  Final Result      Medications  cefTRIAXone (ROCEPHIN) injection 500 mg (has no administration in time range)  HYDROmorphone (DILAUDID) injection 1 mg (1 mg Intramuscular Given 08/30/22 0530)     Procedures  /  Critical Care Procedures  ED Course and Medical Decision Making  Initial Impression and Ddx Differential diagnosis includes kidney stone, pyelonephritis, diverticulitis, torsion, urethritis, epididymitis, UTI  Past  medical/surgical history that increases complexity of ED encounter: None  Interpretation of Diagnostics I personally reviewed the laboratory assessment and my interpretation is as follows: No significant blood count or electrolyte disturbance  Ultrasound is without evidence of torsion.  CT scan is without evidence of diverticulitis or appendicitis or kidney stone.  There is evidence of left inguinal lymphadenopathy.  I did not appreciate any lymphadenopathy on external genital exam.  Patient Reassessment and Ultimate Disposition/Management     Patient has had multiple sexual partners lately and so we will treat for any possible STDs and the antibiotic should cover for adenitis as well, appropriate for discharge.  Patient management required discussion with the following services or consulting groups:  None  Complexity of Problems Addressed Acute illness or injury that poses threat of life of bodily function  Additional Data Reviewed and Analyzed Further history obtained from: None  Additional Factors Impacting ED Encounter Risk Prescriptions  Barth Kirks. Sedonia Small, [place] [email or web address]  Final Clinical Impressions(s) / ED Diagnoses     ICD-10-CM   1. Abdominal pain, unspecified abdominal location  R10.9     2. Lymphadenopathy  R59.1       ED Discharge Orders          Ordered    doxycycline (VIBRAMYCIN) 100 MG capsule  2 times daily        08/30/22 0627             Discharge Instructions Discussed with and Provided to Patient:     Discharge Instructions      You were evaluated in the Emergency Department and after careful evaluation, we did not find any emergent condition requiring admission or further testing in the hospital.  Your exam/testing today is overall reassuring.  Take the antibiotics as directed to treat the inflamed lymph node.  Avoid sexual intercourse while on this medicine.  Please return  to the Emergency Department if you experience any worsening of your condition.   Thank you for allowing [place] to be a part of your care.       Maudie Flakes, MD 08/30/22 0630

## 2022-08-30 NOTE — Discharge Instructions (Signed)
You were evaluated in the Emergency Department and after careful evaluation, we did not find any emergent condition requiring admission or further testing in the hospital.  Your exam/testing today is overall reassuring.  Take the antibiotics as directed to treat the inflamed lymph node.  Avoid sexual intercourse while on this medicine.  Please return to the Emergency Department if you experience any worsening of your condition.   Thank you for allowing [place] to be a part of your care.

## 2023-02-03 ENCOUNTER — Emergency Department (HOSPITAL_COMMUNITY)
Admission: EM | Admit: 2023-02-03 | Discharge: 2023-02-03 | Disposition: A | Discharge status: Home / Self Care | Payer: BC Managed Care – PPO | Attending: Emergency Medicine | Admitting: Emergency Medicine

## 2023-02-03 ENCOUNTER — Emergency Department (HOSPITAL_COMMUNITY): Payer: BC Managed Care – PPO

## 2023-02-03 ENCOUNTER — Other Ambulatory Visit: Payer: Self-pay

## 2023-02-03 DIAGNOSIS — S62664A Nondisplaced fracture of distal phalanx of right ring finger, initial encounter for closed fracture: Secondary | ICD-10-CM | POA: Diagnosis not present

## 2023-02-03 DIAGNOSIS — M79641 Pain in right hand: Secondary | ICD-10-CM | POA: Diagnosis present

## 2023-02-03 DIAGNOSIS — W208XXA Other cause of strike by thrown, projected or falling object, initial encounter: Secondary | ICD-10-CM | POA: Diagnosis not present

## 2023-02-03 MED ORDER — IBUPROFEN 800 MG PO TABS
800.0000 mg | ORAL_TABLET | Freq: Once | ORAL | Status: AC
  Administered 2023-02-03: 800 mg via ORAL
  Filled 2023-02-03: qty 1

## 2023-02-03 MED ORDER — IBUPROFEN 800 MG PO TABS: 800.0000 mg | ORAL_TABLET | Freq: Three times a day (TID) | ORAL | 0 refills | Status: DC

## 2023-02-03 NOTE — ED Provider Notes (Signed)
[facility]-EMERGENCY DEPT [facility] Emergency Department Provider Note MRN:  [id number]  Arrival date & time: 02/03/23     Chief Complaint   Hand Injury   History of Present Illness   Blake Perry is a 22 y.o. year-old male presents to the ED with chief complaint of right hand pain.  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  Reports pain near the base of the 3rd and 4th fingers.  Symptoms worsened with ROM and palpation.  History provided by patient.   Review of Systems  Pertinent positive and negative review of systems noted in HPI.    Physical Exam   Vitals:   02/03/23 2300  BP: 101/64  Pulse: (!) 109  Resp: 18  Temp: 98.2 F (36.8 C)  SpO2: 98%    CONSTITUTIONAL:  non toxic-appearing, NAD NEURO:  Alert and oriented x 3, CN 3-12 grossly intact EYES:  eyes equal and reactive ENT/NECK:  Supple, no stridor  CARDIO:  appears well-perfused  PULM:  No respiratory distress,  GI/GU:  non-distended,  MSK/SPINE:  TTP to the R distal 4th phalanx, remaining digits and had are without abnormality SKIN:  no rash, atraumatic   *Additional and/or pertinent findings included in MDM below  Diagnostic and Interventional Summary    EKG Interpretation Date/Time:    Ventricular Rate:    PR Interval:    QRS Duration:    QT Interval:    QTC Calculation:   R Axis:      Text Interpretation:         Labs Reviewed - No data to display  DG Hand Complete Right  Final Result      Medications  ibuprofen (ADVIL) tablet 800 mg (has no administration in time range)     Procedures  /  Critical Care Procedures  ED Course and Medical Decision Making  I have reviewed the triage vital signs, the nursing notes, and pertinent available records from the EMR.  Social Determinants Affecting Complexity of Care: Patient has no clinically significant social determinants affecting this chief complaint..   ED Course:    Medical Decision  Making Patient presents with injury to right hand.  DDx includes, fracture, strain, or sprain.  Consultants: none  Plain films reveal fx to 4th distal phalanx.  Pt advised to follow up with PCP and/or orthopedics. Patient given splint while in ED, conservative therapy such as RICE recommended and discussed.   Patient will be discharged home & is agreeable with above plan. Returns precautions discussed. Pt appears safe for discharge.   Amount and/or Complexity of Data Reviewed Radiology: ordered and independent interpretation performed.    Details: Fracture of distal 4th phalanx  Risk Prescription drug management.         Consultants: No consultations were needed in caring for this patient.   Treatment and Plan: Emergency department workup does not suggest an emergent condition requiring admission or immediate intervention beyond  what has been performed at this time. The patient is safe for discharge and has  been instructed to return immediately for worsening symptoms, change in  symptoms or any other concerns    Final Clinical Impressions(s) / ED Diagnoses     ICD-10-CM   1. Closed nondisplaced fracture of distal phalanx of right ring finger, initial encounter  W09.811B       ED Discharge Orders          Ordered    ibuprofen (ADVIL) 800 MG tablet  3  times daily        02/03/23 2345              Discharge Instructions Discussed with and Provided to Patient:   Discharge Instructions   None      Roxy Horseman, PA-C 02/03/23 2346    Glynn Octave, MD 02/04/23 [phone]

## 2023-02-03 NOTE — ED Triage Notes (Signed)
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 Pt has swelling and pain at base of 3rd and 4th digits.

## 2024-01-26 ENCOUNTER — Emergency Department (HOSPITAL_COMMUNITY)

## 2024-01-26 ENCOUNTER — Other Ambulatory Visit: Payer: Self-pay

## 2024-01-26 ENCOUNTER — Emergency Department (HOSPITAL_COMMUNITY)
Admission: EM | Admit: 2024-01-26 | Discharge: 2024-01-26 | Disposition: A | Discharge status: Home / Self Care | Attending: Emergency Medicine | Admitting: Emergency Medicine

## 2024-01-26 DIAGNOSIS — Z23 Encounter for immunization: Secondary | ICD-10-CM | POA: Insufficient documentation

## 2024-01-26 DIAGNOSIS — S61250A Open bite of right index finger without damage to nail, initial encounter: Secondary | ICD-10-CM | POA: Diagnosis present

## 2024-01-26 DIAGNOSIS — W540XXA Bitten by dog, initial encounter: Secondary | ICD-10-CM | POA: Diagnosis not present

## 2024-01-26 MED ORDER — OXYCODONE-ACETAMINOPHEN 5-325 MG PO TABS
1.0000 | ORAL_TABLET | Freq: Once | ORAL | Status: AC
  Administered 2024-01-26: 1 via ORAL
  Filled 2024-01-26: qty 1

## 2024-01-26 MED ORDER — TETANUS-DIPHTH-ACELL PERTUSSIS 5-2.5-18.5 LF-MCG/0.5 IM SUSY
0.5000 mL | PREFILLED_SYRINGE | Freq: Once | INTRAMUSCULAR | Status: AC
  Filled 2024-01-26: qty 0.5

## 2024-01-26 MED ORDER — AMOXICILLIN-POT CLAVULANATE 875-125 MG PO TABS: 1.0000 | ORAL_TABLET | Freq: Two times a day (BID) | ORAL | 0 refills | Status: DC

## 2024-01-26 MED ORDER — IBUPROFEN 600 MG PO TABS: 600.0000 mg | ORAL_TABLET | Freq: Four times a day (QID) | ORAL | 0 refills | Status: DC | PRN

## 2024-01-26 MED ORDER — TETANUS-DIPHTH-ACELL PERTUSSIS 5-2.5-18.5 LF-MCG/0.5 IM SUSY
PREFILLED_SYRINGE | INTRAMUSCULAR | Status: AC
  Administered 2024-01-26: 0.5 mL via INTRAMUSCULAR
  Filled 2024-01-26: qty 0.5

## 2024-01-26 MED ORDER — AMOXICILLIN-POT CLAVULANATE 875-125 MG PO TABS
1.0000 | ORAL_TABLET | Freq: Once | ORAL | Status: AC
  Administered 2024-01-26: 1 via ORAL
  Filled 2024-01-26: qty 1

## 2024-01-26 MED ORDER — OXYCODONE-ACETAMINOPHEN 5-325 MG PO TABS: 1.0000 | ORAL_TABLET | Freq: Four times a day (QID) | ORAL | 0 refills | Status: DC | PRN

## 2024-01-26 NOTE — Discharge Instructions (Signed)
 It is important to take the antibiotic as prescribed. Take ibuprofen  every 6 hours for pain and inflammation and Percocet for severe pain as or if needed.   Please call Dr. Beverley to schedule an appointment for recheck of this wound this week. Return to the ED with any new or concerning symptoms at any time.   You will be notified by Animal Control if rabies vaccination is necessary.

## 2024-01-26 NOTE — ED Triage Notes (Signed)
 Patient to ED by POV with c/o right hand injury from a dog bite that happened last night. Unaware of last tetanus shot. Area affected is right second digit.

## 2024-01-26 NOTE — ED Provider Notes (Signed)
 [place] EMERGENCY DEPARTMENT AT [facility] Provider Note   CSN: [id number] Arrival date & time: 01/26/24  9248     Patient presents with: Animal Bite   Blake Perry is a 23 y.o. male.   Patient to ED with dog bite to finger that occurred last night. He went to a friend's house and was bitten when he greeted the dog, a large breed boxer. Bite to right index finger x 1. Tetanus status unknown.   The history is provided by the patient. No language interpreter was used.  Animal Bite      Prior to Admission medications   Medication Sig Start Date End Date Taking? Authorizing Provider  amoxicillin-clavulanate (AUGMENTIN) 875-125 MG tablet Take 1 tablet by mouth every 12 (twelve) hours. 01/26/24  Yes Elzie Sheets, Margit, PA-C  ibuprofen  (ADVIL ) 600 MG tablet Take 1 tablet (600 mg total) by mouth every 6 (six) hours as needed. 01/26/24  Yes Odell Margit, PA-C  oxyCODONE -acetaminophen  (PERCOCET/ROXICET) 5-325 MG tablet Take 1 tablet by mouth every 6 (six) hours as needed for severe pain (pain score 7-10). 01/26/24  Yes Ebert Forrester, PA-C  tiZANidine  (ZANAFLEX ) 2 MG tablet Take 1 tablet (2 mg total) by mouth every 8 (eight) hours as needed for muscle spasms. 04/27/21   Arlyss Leita BRAVO, PA-C    Allergies: Bee venom    Review of Systems  Updated Vital Signs BP 128/72   Pulse 82   Resp 20   Ht 6' 3 (1.905 m)   Wt 86.2 kg   SpO2 100%   BMI 23.75 kg/m   Physical Exam Vitals and nursing note reviewed.  Constitutional:      Appearance: He is well-developed.  Pulmonary:     Effort: Pulmonary effort is normal.   Musculoskeletal:        General: Normal range of motion.     Cervical back: Normal range of motion.   Skin:    General: Skin is warm and dry.     Comments: Single puncture wound to palmar right index finger over middle phalanx. Superficial abrasion dorsally. No bony deformity. Moderate swelling.   Neurological:     Mental Status: He is alert and oriented  to person, place, and time.     (all labs ordered are listed, but only abnormal results are displayed) Labs Reviewed - No data to display  EKG: None  Radiology: DG Finger Index Right Result Date: 01/26/2024 CLINICAL DATA:  Bite injury from dog last evening. EXAM: RIGHT INDEX FINGER 2+V COMPARISON:  02/03/2023 FINDINGS: There is no evidence of fracture or dislocation. There is no evidence of arthropathy or other focal bone abnormality. Soft tissues are unremarkable. IMPRESSION: Negative. Electronically Signed   By: Waddell Calk M.D.   On: 01/26/2024 09:13     Procedures   Medications Ordered in the ED  oxyCODONE -acetaminophen  (PERCOCET/ROXICET) 5-325 MG per tablet 1 tablet (1 tablet Oral Given 01/26/24 0854)  Tdap (BOOSTRIX) injection 0.5 mL (0.5 mLs Intramuscular Given 01/26/24 0855)  amoxicillin-clavulanate (AUGMENTIN) 875-125 MG per tablet 1 tablet (1 tablet Oral Given 01/26/24 0854)    Clinical Course as of 01/26/24 1008  Sun Jan 26, 2024  0829 Single wound dog bite to finger. Dog's location is known. Will notify Animal control for follow up on rabies status. For now, Augmentin started, tetanus updated, pain addressed, xray pending. [SU]  1003 Xray negative for fracture. Tetanus updated. Wound is cleaned and finger is splinted for comfort. Animal Control to be involved from nursing  standpoint.  [SU]    Clinical Course User Index [SU] Odell Balls, PA-C                                 Medical Decision Making Amount and/or Complexity of Data Reviewed Radiology: ordered.  Risk Prescription drug management.        Final diagnoses:  Dog bite, initial encounter    ED Discharge Orders          Ordered    amoxicillin-clavulanate (AUGMENTIN) 875-125 MG tablet  Every 12 hours        01/26/24 1006    oxyCODONE -acetaminophen  (PERCOCET/ROXICET) 5-325 MG tablet  Every 6 hours PRN        01/26/24 1006    ibuprofen  (ADVIL ) 600 MG tablet  Every 6 hours PRN        01/26/24  1006               Odell Balls, PA-C 01/26/24 1008    Mannie Pac T, DO 01/27/24 [phone]

## 2024-01-28 ENCOUNTER — Observation Stay (HOSPITAL_COMMUNITY)
Admission: EM | Admit: 2024-01-28 | Discharge: 2024-01-29 | Disposition: A | Discharge status: Home / Self Care | Attending: Family Medicine | Admitting: Family Medicine

## 2024-01-28 ENCOUNTER — Other Ambulatory Visit: Payer: Self-pay

## 2024-01-28 ENCOUNTER — Encounter (HOSPITAL_COMMUNITY): Payer: Self-pay | Admitting: Emergency Medicine

## 2024-01-28 DIAGNOSIS — L03113 Cellulitis of right upper limb: Secondary | ICD-10-CM | POA: Diagnosis not present

## 2024-01-28 DIAGNOSIS — W540XXA Bitten by dog, initial encounter: Secondary | ICD-10-CM | POA: Diagnosis not present

## 2024-01-28 DIAGNOSIS — E441 Mild protein-calorie malnutrition: Secondary | ICD-10-CM | POA: Diagnosis not present

## 2024-01-28 DIAGNOSIS — M65149 Other infective (teno)synovitis, unspecified hand: Secondary | ICD-10-CM | POA: Diagnosis not present

## 2024-01-28 DIAGNOSIS — D649 Anemia, unspecified: Secondary | ICD-10-CM | POA: Insufficient documentation

## 2024-01-28 DIAGNOSIS — J45909 Unspecified asthma, uncomplicated: Secondary | ICD-10-CM | POA: Insufficient documentation

## 2024-01-28 DIAGNOSIS — D696 Thrombocytopenia, unspecified: Secondary | ICD-10-CM | POA: Diagnosis not present

## 2024-01-28 DIAGNOSIS — M79644 Pain in right finger(s): Secondary | ICD-10-CM | POA: Diagnosis present

## 2024-01-28 LAB — CBC WITH DIFFERENTIAL/PLATELET
Abs Immature Granulocytes: 0.01 10*3/uL (ref 0.00–0.07)
Abs Immature Granulocytes: 0 10*3/uL (ref 0.00–0.07)
Basophils Absolute: 0 10*3/uL (ref 0.0–0.1)
Basophils Absolute: 0 10*3/uL (ref 0.0–0.1)
Basophils Relative: 0 %
Basophils Relative: 0 %
Eosinophils Absolute: 0.3 10*3/uL (ref 0.0–0.5)
Eosinophils Absolute: 0.2 10*3/uL (ref 0.0–0.5)
Eosinophils Relative: 4 %
Eosinophils Relative: 5 %
HCT: 40.2 % (ref 39.0–52.0)
HCT: 36.1 % — ABNORMAL LOW (ref 39.0–52.0)
Hemoglobin: 13.3 g/dL (ref 13.0–17.0)
Hemoglobin: 12.1 g/dL — ABNORMAL LOW (ref 13.0–17.0)
Immature Granulocytes: 0 %
Immature Granulocytes: 0 %
Lymphocytes Relative: 46 %
Lymphocytes Relative: 47 %
Lymphs Abs: 2.6 10*3/uL (ref 0.7–4.0)
Lymphs Abs: 2.1 10*3/uL (ref 0.7–4.0)
MCH: 29.6 pg (ref 26.0–34.0)
MCH: 29.8 pg (ref 26.0–34.0)
MCHC: 33.1 g/dL (ref 30.0–36.0)
MCHC: 33.5 g/dL (ref 30.0–36.0)
MCV: 89.3 fL (ref 80.0–100.0)
MCV: 88.9 fL (ref 80.0–100.0)
Monocytes Absolute: 0.4 10*3/uL (ref 0.1–1.0)
Monocytes Absolute: 0.3 10*3/uL (ref 0.1–1.0)
Monocytes Relative: 7 %
Monocytes Relative: 8 %
Neutro Abs: 2.5 10*3/uL (ref 1.7–7.7)
Neutro Abs: 1.8 10*3/uL (ref 1.7–7.7)
Neutrophils Relative %: 43 %
Neutrophils Relative %: 40 %
Platelets: 175 10*3/uL (ref 150–400)
Platelets: 130 10*3/uL — ABNORMAL LOW (ref 150–400)
RBC: 4.5 MIL/uL (ref 4.22–5.81)
RBC: 4.06 MIL/uL — ABNORMAL LOW (ref 4.22–5.81)
RDW: 12.1 % (ref 11.5–15.5)
RDW: 11.9 % (ref 11.5–15.5)
WBC: 5.8 10*3/uL (ref 4.0–10.5)
WBC: 4.4 10*3/uL (ref 4.0–10.5)
nRBC: 0 % (ref 0.0–0.2)
nRBC: 0 % (ref 0.0–0.2)

## 2024-01-28 LAB — COMPREHENSIVE METABOLIC PANEL WITH GFR
ALT: 14 U/L (ref 0–44)
AST: 21 U/L (ref 15–41)
Albumin: 3.4 g/dL — ABNORMAL LOW (ref 3.5–5.0)
Alkaline Phosphatase: 57 U/L (ref 38–126)
Anion gap: 10 (ref 5–15)
BUN: 16 mg/dL (ref 6–20)
CO2: 27 mmol/L (ref 22–32)
Calcium: 8.8 mg/dL — ABNORMAL LOW (ref 8.9–10.3)
Chloride: 102 mmol/L (ref 98–111)
Creatinine, Ser: 1.12 mg/dL (ref 0.61–1.24)
GFR, Estimated: >60 mL/min (ref >60)
Glucose, Bld: 95 mg/dL (ref 70–99)
Potassium: 4 mmol/L (ref 3.5–5.1)
Sodium: 139 mmol/L (ref 135–145)
Total Bilirubin: 0.6 mg/dL (ref 0.0–1.2)
Total Protein: 6 g/dL — ABNORMAL LOW (ref 6.5–8.1)

## 2024-01-28 LAB — BASIC METABOLIC PANEL WITH GFR
Anion gap: 8 (ref 5–15)
BUN: 20 mg/dL (ref 6–20)
CO2: 27 mmol/L (ref 22–32)
Calcium: 8.9 mg/dL (ref 8.9–10.3)
Chloride: 100 mmol/L (ref 98–111)
Creatinine, Ser: 1.33 mg/dL — ABNORMAL HIGH (ref 0.61–1.24)
GFR, Estimated: >60 mL/min (ref >60)
Glucose, Bld: 99 mg/dL (ref 70–99)
Potassium: 4.4 mmol/L (ref 3.5–5.1)
Sodium: 135 mmol/L (ref 135–145)

## 2024-01-28 LAB — PROTIME-INR
INR: 1.1 (ref 0.8–1.2)
Prothrombin Time: 14.5 s (ref 11.4–15.2)

## 2024-01-28 LAB — MAGNESIUM: Magnesium: 2 mg/dL (ref 1.7–2.4)

## 2024-01-28 LAB — C-REACTIVE PROTEIN: CRP: <0.5 mg/dL (ref <1.0)

## 2024-01-28 LAB — SEDIMENTATION RATE: Sed Rate: 3 mm/h (ref 0–16)

## 2024-01-28 MED ORDER — FENTANYL CITRATE PF 50 MCG/ML IJ SOSY: 100.0000 ug | PREFILLED_SYRINGE | INTRAMUSCULAR | Status: DC | PRN

## 2024-01-28 MED ORDER — LACTATED RINGERS IV BOLUS
1000.0000 mL | Freq: Once | INTRAVENOUS | Status: AC
  Administered 2024-01-28: 1000 mL via INTRAVENOUS

## 2024-01-28 MED ORDER — ACETAMINOPHEN 650 MG RE SUPP: 650.0000 mg | Freq: Four times a day (QID) | RECTAL | Status: DC | PRN

## 2024-01-28 MED ORDER — ACETAMINOPHEN 325 MG PO TABS
650.0000 mg | ORAL_TABLET | Freq: Four times a day (QID) | ORAL | Status: DC | PRN
  Filled 2024-01-28: qty 2

## 2024-01-28 MED ORDER — MORPHINE SULFATE (PF) 4 MG/ML IV SOLN
6.0000 mg | Freq: Once | INTRAVENOUS | Status: AC
  Administered 2024-01-28: 6 mg via INTRAVENOUS
  Filled 2024-01-28: qty 2

## 2024-01-28 MED ORDER — OXYCODONE HCL 5 MG PO TABS
5.0000 mg | ORAL_TABLET | Freq: Four times a day (QID) | ORAL | Status: DC | PRN
  Administered 2024-01-28 – 2024-01-29 (×3): 5 mg via ORAL
  Filled 2024-01-28 (×3): qty 1

## 2024-01-28 MED ORDER — VANCOMYCIN HCL 1500 MG/300ML IV SOLN
1500.0000 mg | Freq: Two times a day (BID) | INTRAVENOUS | Status: DC
  Administered 2024-01-28 – 2024-01-29 (×4): 1500 mg via INTRAVENOUS
  Filled 2024-01-28 (×4): qty 300

## 2024-01-28 MED ORDER — HYDROMORPHONE HCL 1 MG/ML IJ SOLN
1.0000 mg | Freq: Once | INTRAMUSCULAR | Status: AC
  Administered 2024-01-28: 1 mg via INTRAVENOUS
  Filled 2024-01-28: qty 1

## 2024-01-28 MED ORDER — SODIUM CHLORIDE 0.9 % IV SOLN
1.0000 g | INTRAVENOUS | Status: DC
  Administered 2024-01-28 – 2024-01-29 (×2): 1 g via INTRAVENOUS
  Filled 2024-01-28: qty 10

## 2024-01-28 MED ORDER — ONDANSETRON HCL 4 MG/2ML IJ SOLN: 4.0000 mg | Freq: Four times a day (QID) | INTRAMUSCULAR | Status: DC | PRN

## 2024-01-28 MED ORDER — KETOROLAC TROMETHAMINE 30 MG/ML IJ SOLN
30.0000 mg | Freq: Three times a day (TID) | INTRAMUSCULAR | Status: AC
  Administered 2024-01-28 – 2024-01-29 (×3): 30 mg via INTRAVENOUS
  Filled 2024-01-28 (×3): qty 1

## 2024-01-28 MED ORDER — SODIUM CHLORIDE 0.9 % IV SOLN
1.0000 g | Freq: Once | INTRAVENOUS | Status: AC
  Administered 2024-01-28: 1 g via INTRAVENOUS
  Filled 2024-01-28: qty 10

## 2024-01-28 MED ORDER — HYDROMORPHONE HCL 1 MG/ML IJ SOLN
0.5000 mg | INTRAMUSCULAR | Status: DC | PRN
  Administered 2024-01-28 (×3): 0.5 mg via INTRAVENOUS
  Filled 2024-01-28 (×2): qty 0.5

## 2024-01-28 MED ORDER — NALOXONE HCL 0.4 MG/ML IJ SOLN: 0.4000 mg | INTRAMUSCULAR | Status: DC | PRN

## 2024-01-28 NOTE — ED Provider Notes (Signed)
 [place] EMERGENCY DEPARTMENT AT [facility] Provider Note   CSN: [id number] Arrival date & time: 01/28/24  0102     History Chief Complaint  Patient presents with   Animal Bite    HPI Blake Perry is a 23 y.o. male presenting for dog bite.  Right hand index finger was bit by a boxer on Sunday.. Seen and started on Augmentin.  Rapid erythema, fluctuance and severe pain of the finger. Otherwise healthy 23 year old male.  Patient's recorded medical, surgical, social, medication list and allergies were reviewed in the Snapshot window as part of the initial history.   Review of Systems   Review of Systems  Constitutional:  Negative for chills and fever.  HENT:  Negative for ear pain and sore throat.   Eyes:  Negative for pain and visual disturbance.  Respiratory:  Negative for cough and shortness of breath.   Cardiovascular:  Negative for chest pain and palpitations.  Gastrointestinal:  Negative for abdominal pain and vomiting.  Genitourinary:  Negative for dysuria and hematuria.  Musculoskeletal:  Negative for arthralgias and back pain.  Skin:  Negative for color change and rash.  Neurological:  Negative for seizures and syncope.  All other systems reviewed and are negative.   Physical Exam Updated Vital Signs BP (!) 154/70 (BP Location: Left Arm)   Pulse 80   Temp 98.8 F (37.1 C)   Resp 18   SpO2 100%  Physical Exam Vitals and nursing note reviewed.  Constitutional:      General: He is not in acute distress.    Appearance: He is well-developed.  HENT:     Head: Normocephalic and atraumatic.   Eyes:     Conjunctiva/sclera: Conjunctivae normal.    Cardiovascular:     Rate and Rhythm: Normal rate and regular rhythm.     Heart sounds: No murmur heard. Pulmonary:     Effort: Pulmonary effort is normal. No respiratory distress.     Breath sounds: Normal breath sounds.  Abdominal:     Palpations: Abdomen is soft.     Tenderness: There is no  abdominal tenderness.   Musculoskeletal:        General: Deformity and signs of injury present. No swelling.     Cervical back: Neck supple.   Skin:    General: Skin is warm and dry.     Capillary Refill: Capillary refill takes less than 2 seconds.     Coloration: Skin is pale.   Neurological:     Mental Status: He is alert.   Psychiatric:        Mood and Affect: Mood normal.           ED Course/ Medical Decision Making/ A&P    Procedures Procedures   Medications Ordered in ED Medications  morphine  (PF) 4 MG/ML injection 6 mg (has no administration in time range)    Medical Decision Making:   23 year old male presenting with a chief complaint of index finger swelling and pain. Having substantial erythema and swelling after a dog bite.  Has been compliant with antibiotics. Redness has been swelling up to the metacarpals today. Held in flexed position, pain with passive extension, grossly swollen.  Concerning for flexor tendon tenosynovitis. Consult to hand surgery for further recommendations on management.  Blood work, pain control and IV antibiotics to be initiated    Reassessment: Dr. Bubba of Ortho care recommended IV antibiotics admission overnight and they will evaluate in the morning for possible operative intervention.  Patient placed n.p.o., pain medication ordered.  Patient resting comfortably at this time.  Disposition:   Based on the above findings, I believe this patient is stable for admission.    Patient/family educated about specific findings on our evaluation and explained exact reasons for admission.  Patient/family educated about clinical situation and time was allowed to answer questions.   Admission team communicated with and agreed with need for admission. Patient admitted. Patient ready to move at this time.     Emergency Department Medication Summary:   Medications  cefTRIAXone  (ROCEPHIN ) 1 g in sodium chloride  0.9 % 100 mL IVPB (1 g  Intravenous New Bag/Given 01/28/24 0224)  lactated ringers  bolus 1,000 mL (has no administration in time range)  morphine  (PF) 4 MG/ML injection 6 mg (6 mg Intravenous Given 01/28/24 0200)         Clinical Impression:  1. Suppurative tenosynovitis of flexor tendon      Data Unavailable   Final Clinical Impression(s) / ED Diagnoses Final diagnoses:  Suppurative tenosynovitis of flexor tendon    Rx / DC Orders ED Discharge Orders     None         Jerral Meth, MD 01/28/24 [phone]

## 2024-01-28 NOTE — Evaluation (Signed)
 Occupational Therapy Evaluation Patient Details Name: Blake Perry MRN: [id number] DOB: 02-08-2001 Today's Date: 01/28/2024   History of Present Illness   Pt is a 23 y.o. male admitted with R index finger dog bite injury. Ortho consulted, no surgical intervention recommended and OT consulted for early ROM of finger.     Clinical Impressions Pt admitted with above diagnosis. Prior to hospital admission, pt was very active, working multiple jobs and driving. Supportive mother present on OT eval. Pt pre-medicated prior to session, RN reported to OT that pt has limited tolerance to soaking affected hand in warm soapy water due to elevated pain. Appreciate assist from NSG staff in warm water baths to assist with healing.   Pt has been performing ADLs/mobility independently in room. Presents to acute OT with AROM deficits in dominant RUE due to pain, decreased FMC, strength and bimanual task coordination. OT reviewed schedule for warm, soapy water soaks 3x daily per orders, and importance of ROM as tolerated for joint integrity, edema reduction and to assist with improving functional use of hand (see flowsheet for further details). Pt verbalizes understanding. Next session to review formal HEP, recommending outpatient OT for follow-up at hospital discharge. OT will continue to follow acutely.      If plan is discharge home, recommend the following:   Assist for transportation     Functional Status Assessment   Patient has had a recent decline in their functional status and demonstrates the ability to make significant improvements in function in a reasonable and predictable amount of time.     Equipment Recommendations   None recommended by OT     Recommendations for Other Services         Precautions/Restrictions   Precautions Precautions: None Restrictions Weight Bearing Restrictions Per Provider Order: No     Mobility Bed Mobility Overal bed mobility: Independent                   Transfers Overall transfer level: Independent                        Balance Overall balance assessment: Independent                                         ADL either performed or assessed with clinical judgement   ADL Overall ADL's : Modified independent                                       General ADL Comments: pt has been performing self-care tasks in room with compensatory use of LUE; assist from mother when needed for tasks requiring [facility] and bimanual task coordination.     Vision Baseline Vision/History: 0 No visual deficits Patient Visual Report: No change from baseline              Pertinent Vitals/Pain Pain Assessment Pain Assessment: 0-10 Pain Score: 8  Pain Location: R hand Pain Descriptors / Indicators: Throbbing Pain Intervention(s): Limited activity within patient's tolerance, Premedicated before session     Extremity/Trunk Assessment Upper Extremity Assessment Upper Extremity Assessment: Right hand dominant;RUE deficits/detail RUE Deficits / Details: pt unable to perform AROM of affected digit; pain increases with AROM of wrist, pronation/supination and flexion/exension of other digits. Unable to attempt composite fist, limited  AROM of thumb and non-injured digits in warm water bath. RUE: Unable to fully assess due to pain RUE Coordination: decreased gross motor;decreased fine motor   Lower Extremity Assessment Lower Extremity Assessment: Overall WFL for tasks assessed   Cervical / Trunk Assessment Cervical / Trunk Assessment: Normal   Communication Communication Communication: No apparent difficulties   Cognition Arousal: Alert Behavior During Therapy: WFL for tasks assessed/performed Cognition: No apparent impairments                               Following commands: Intact       Cueing  General Comments   Cueing Techniques: Verbal cues  R index finger  swollen   Exercises Hand Exercises Forearm Supination: AROM, 5 reps, Seated, Right Forearm Pronation: AROM, Right, 5 reps, Seated Wrist Flexion: AROM, Right, 5 reps Wrist Extension: AROM, Right (minimal extension 0-30 deg tolerated) Other Exercises Other Exercises: Educated pt and mom regarding role of OT, recommendation for follow-up with outpatient OT for futher hand therapy. Other Exercises: Edu provided on importance of mobiliy, discussed use of warm soapy soaks 3x daily, and gradually building up AROM tolerance while in warm water to assist with swelling and joint mobilization. OT provided demonstration of pronation/supination, wrist extension/flexion, and PIP/DIP blocking for unaffected digits to progress mobiliy. Discussed elevation of extremity to assist with edema control. Other Exercises: Pt tolerated +4 mins of soaking in warm soapy water. Unable to progress ROM to injured digit due to pain, pt tolerates minimal exercises to hand overall. Pt slightly able to move unafected digits in water after several minutes but minimal tolerance overall.   Shoulder Instructions      Home Living Family/patient expects to be discharged to:: Private residence Living Arrangements: Alone Available Help at Discharge: Family Type of Home: House Home Access: Level entry     Home Layout: One level               Home Equipment: None              OT Problem List: Decreased strength;Decreased range of motion;Decreased activity tolerance;Impaired UE functional use;Pain;Increased edema;Decreased coordination   OT Treatment/Interventions: Self-care/ADL training;Therapeutic exercise;Neuromuscular education;Modalities;Patient/family education;DME and/or AE instruction;Therapeutic activities      OT Goals(Current goals can be found in the care plan section)   Acute Rehab OT Goals OT Goal Formulation: With patient Time For Goal Achievement: 02/11/24 Potential to Achieve Goals: Good   OT  Frequency:  Min 2X/week    Co-evaluation              AM-PAC OT 6 Clicks Daily Activity     Outcome Measure Help from another person eating meals?: None Help from another person taking care of personal grooming?: None Help from another person toileting, which includes using toliet, bedpan, or urinal?: None Help from another person bathing (including washing, rinsing, drying)?: None Help from another person to put on and taking off regular upper body clothing?: None Help from another person to put on and taking off regular lower body clothing?: None 6 Click Score: 24   End of Session Nurse Communication: Mobility status  Activity Tolerance: Patient limited by pain Patient left: in bed;with family/visitor present  OT Visit Diagnosis: Pain;Other abnormalities of gait and mobility (R26.89) Pain - Right/Left: Right Pain - part of body: Hand;Arm                Time: 8867-8841 OT Time Calculation (min):  26 min Charges:  OT General Charges $OT Visit: 1 Visit OT Evaluation $OT Eval Low Complexity: 1 Low OT Treatments $Therapeutic Activity: 8-22 mins  Nygel Prokop L. Selvin Yun, OTR/L  01/28/24, 12:53 PM

## 2024-01-28 NOTE — ED Triage Notes (Signed)
 Pt had dog biteto right index finger on 6/28.  Pt reports he has had increase in pain and swelling since being seen for same. Has been taking medicine as prescribed.  Finger has an area of drainage now as well.

## 2024-01-28 NOTE — Progress Notes (Signed)
 Pharmacy Antibiotic Note  Blake Perry is a 23 y.o. male admitted on 01/28/2024 with cellulitis.  Pharmacy has been consulted for Vancomycin  dosing.  Plan: Vancomycin  1500mg  IV q12h to target AUC 400-550.   Estimated AUC on this regimen = 532 Monitor renal function and cx data  Check levels as needed      Temp (24hrs), Avg:98.8 F (37.1 C), Min:98.8 F (37.1 C), Max:98.8 F (37.1 C)  Recent Labs  Lab 01/28/24 0156  WBC 5.8  CREATININE 1.33*    Estimated Creatinine Clearance: 104.1 mL/min (A) (by C-G formula based on SCr of 1.33 mg/dL (H)).    Allergies  Allergen Reactions   Bee Venom Swelling    Not anaphylaxis yet    Antimicrobials this admission: 7/1 Rocephin  >>  7/1 Vancomycin  >>   Dose adjustments this admission:  Microbiology results: 7/1 BCx:   Thank you for allowing pharmacy to be a part of this patient's care.  Rosaline Millet PharmD 01/28/2024 3:39 AM

## 2024-01-28 NOTE — Progress Notes (Signed)
  Carryover admission to the Day Admitter.  I discussed this case with the EDP, Dr. Jerral.  Per these discussions:   This is a 23 year old male who is being admitted with  flexor tenosynovitis of right index finger complicated by cellulitis of right hand after dog bite that occurred 48 hours ago.  He was started on Augmentin in the interval, but is noted worsening discomfort, swelling, erythema leading to the right index finger, now with diminished mobility.  EDP has discussed case with on-call hand surgery, Dr. Bubba, who conveys that he will formally consult and is planning to take the pt to the OR in the AM (7/1).   In the ED this evening, blood cultures x 2 were collected prior to initiation of IV vancomycin  and Rocephin .  I have placed an order for observation to MedSurg for further evaluation and management of the above.  I have placed some additional preliminary admit orders via the adult multi-morbid admission order set. I have also ordered n.p.o., prn IV Dilaudid , prn IV Zofran , and have continued the IV vancomycin  and Rocephin  started in the ED.  I have ordered preoperative EKG, INR as well as morning labs that include CMP, CBC, magnesium level.    Eva Pore, DO Hospitalist

## 2024-01-28 NOTE — Consult Note (Signed)
 ORTHOPAEDIC CONSULTATION  REQUESTING PHYSICIAN: Howerter, Eva NOVAK, DO  Chief Complaint: Right index finger by injury  HPI: Blake Perry is a 23 y.o. 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  He is otherwise healthy.  He did present initially to the emergency room 2 days prior.  He was given a dose of antibiotics.  He Bree presented to the emergency room as he is still having persistent pain and swelling  Past Medical History:  Diagnosis Date   Acute deep vein thrombosis (DVT) of left lower extremity after procedure (HCC)    Asthma    Extra digits    Past Surgical History:  Procedure Laterality Date   ARTHROSCOPIC REPAIR ACL     INCISION AND DRAINAGE Left 08/25/2020   Procedure: INCISION AND DRAINAGE LEG DEEP ABSCESS/HEMATOMA;  Surgeon: Cristy Bonner DASEN, MD;  Location: [facility] OR;  Service: Orthopedics;  Laterality: Left;   KNEE ARTHROSCOPY Left 08/25/2020   Procedure: ARTHROSCOPY LEFT KNEE FOR INFECTION A LAVAGE AND DRAINAGE;  Surgeon: Cristy Bonner DASEN, MD;  Location: [facility] OR;  Service: Orthopedics;  Laterality: Left;   URETHRA SURGERY     Social History   Socioeconomic History   Marital status: Single    Spouse name: Not on file   Number of children: Not on file   Years of education: Not on file   Highest education level: Not on file  Occupational History   Not on file  Tobacco Use   Smoking status: Never   Smokeless tobacco: Never  Vaping Use   Vaping status: Former  Substance and Sexual Activity   Alcohol use: Yes    Alcohol/week: 1.0 standard drink of alcohol    Types: 1 Cans of beer per week    Comment: beer occasionally   Drug use: Yes    Types: Marijuana   Sexual activity: Never  Other Topics Concern   Not on file  Social History Narrative   Not on file   Social Drivers of Health    Financial Resource Strain: Not on file  Food Insecurity: No Food Insecurity (01/28/2024)   Hunger Vital Sign    Worried About Running Out of Food in the Last Year: Never true    Ran Out of Food in the Last Year: Never true  Transportation Needs: No Transportation Needs (01/28/2024)   PRAPARE - Administrator, Civil Service (Medical): No    Lack of Transportation (Non-Medical): No  Physical Activity: Not on file  Stress: Not on file  Social Connections: Not on file   History reviewed. No pertinent family history. - negative except otherwise stated in the family history section Allergies  Allergen Reactions   Bee Venom Swelling    Not anaphylaxis yet   Prior to Admission medications   Medication Sig Start Date End Date Taking? Authorizing Provider  albuterol (VENTOLIN HFA) 108 (90 Base) MCG/ACT inhaler Inhale 1-2 puffs into the lungs every 6 (six) hours as needed for wheezing or shortness of breath. 07/13/20  Yes [provider]  amoxicillin-clavulanate (AUGMENTIN) 875-125 MG tablet Take 1 tablet by mouth every 12 (twelve) hours. 01/26/24  Yes Upstill, Margit, PA-C  HYDROcodone-acetaminophen  (NORCO/VICODIN) 5-325 MG tablet Take 1 tablet by mouth every 6 (six) hours as needed (Pain). 07/28/20  Yes [provider]  ibuprofen  (ADVIL ) 600 MG tablet Take 1 tablet (600 mg total) by mouth every 6 (six) hours as needed. 01/26/24  Yes Upstill, Margit, PA-C  oxyCODONE -acetaminophen  (PERCOCET/ROXICET) 5-325 MG tablet Take 1 tablet by mouth every 6 (six) hours as needed for severe pain (pain score 7-10). 01/26/24  Yes Upstill, Shari, PA-C  tiZANidine  (ZANAFLEX ) 2 MG tablet Take 1 tablet (2 mg total) by mouth every 8 (eight) hours as needed for muscle spasms. 04/27/21  Yes Arlyss Leita BRAVO, PA-C   DG Finger Index Right Result Date: 01/26/2024 CLINICAL DATA:  Bite injury from dog last evening. EXAM: RIGHT INDEX FINGER 2+V COMPARISON:  02/03/2023 FINDINGS: There is no evidence of  fracture or dislocation. There is no evidence of arthropathy or other focal bone abnormality. Soft tissues are unremarkable. IMPRESSION: Negative. Electronically Signed   By: Waddell Calk M.D.   On: 01/26/2024 09:13     Positive ROS: All other systems have been reviewed and were otherwise negative with the exception of those mentioned in the HPI and as above.  Physical Exam: General: No acute distress Cardiovascular: No pedal edema Respiratory: No cyanosis, no use of accessory musculature GI: No organomegaly, abdomen is soft and non-tender Skin: No lesions in the area of chief complaint Neurologic: Sensation intact distally Psychiatric: Patient is at baseline mood and affect Lymphatic: No axillary or cervical lymphadenopathy  MUSCULOSKELETAL:  Right index finger is diffusely tender about the dog bite areas.  Little to no tenderness about the proximal A1 pulley or volar aspect of the proximal feelings.  He has pain with passive extension although again this is difficult to assess with his dog bite injury.  He does not have fusiform swelling or evidence of redness erythema.  2+ radial pulse  Independent Imaging Review: 3 views right index finger: No fracture  Assessment: 23 year old male with right index finger dog bite injury orthopedics was consulted to rule out flexor tenosynovitis.  Given his exam findings and negative inflammatory labs, overall I do believe this is unlikely.  He does have evidence of a superficial infection near the dog bite sites.  At this time I would like to have him admitted for approximately 48 hours for antibiotics and warm soapy water bath.  He may work with occupational therapy as tolerated to begin range of motion of the finger to assist with the pain from the crush type injury.  Ideally this would start in a warm soapy water bath to allow for easier motion.  He may do this 3 times daily  Plan: No need for surgical intervention at this time, recommend 3 times  daily warm soapy water baths with 48 hours of antibiotics and occupational therapy for early range of motion about the finger  Thank you for the consult and the opportunity to see [name], MD [facility] 7:35 AM

## 2024-01-28 NOTE — Plan of Care (Signed)
  Problem: Activity: Goal: Risk for activity intolerance will decrease Outcome: Progressing   Problem: Elimination: Goal: Will not experience complications related to urinary retention Outcome: Progressing   Problem: Pain Managment: Goal: General experience of comfort will improve and/or be controlled Outcome: Progressing   Problem: Safety: Goal: Ability to remain free from injury will improve Outcome: Progressing

## 2024-01-28 NOTE — H&P (Signed)
 History and Physical    Patient: Blake Perry FMW:[id number] DOB: [birth year] DOA: 01/28/2024 DOS: the patient was seen and examined on 01/28/2024 PCP: Pcp, No  Patient coming from: Home  Chief Complaint:  Chief Complaint  Patient presents with   Animal Bite   HPI: Blake Perry is a 23 y.o. male with medical history significant of DVT of left lower extremity, asthma who presented to the emergency department complaints of right index dog bite that happened on 01/25/2024 with progressively worse and pain despite taking Augmentin.  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  They have been in the house of the head colds.  He was standing his hand and a dog when he was bitten. He denied fever, chills, rhinorrhea, sore throat, wheezing or hemoptysis.  No chest pain, palpitations, diaphoresis, PND, orthopnea or pitting edema of the lower extremities.  No abdominal pain, nausea, emesis, diarrhea, constipation, melena or hematochezia.  No flank pain, dysuria, frequency or hematuria.  No polyuria, polydipsia, polyphagia or blurred vision.   Lab work: CBC was normal.  BMP showed a creatinine of 1.33 mg deciliter but was otherwise unremarkable.  Normal PT, INR, CRP and sed rate.  Repeat CBC showed a hemoglobin of 12.1 g/dL.  Repeat CMP showed total protein 6.7 albumin 3.4 g/dL, the rest of the CMP measurements were normal after calcium level correction.  Imaging: Right index radiograph taken 2 days ago was normal.   ED course: Initial vital signs were temperature 98.9 F, pulse 80, respiration 18, BP 154/70 mmHg O2 sat 100% on room air.  The patient received ceftriaxone  1 g IVPB, hydromorphone  1 mg IVPB, LR 1000 mL bolus, morphine  6 mg IV.  And vancomycin .  Review of Systems: As mentioned in the history of present illness. All other systems reviewed and are negative. Past Medical History:  Diagnosis Date   Acute deep vein thrombosis (DVT) of left lower  extremity after procedure (HCC)    Asthma    Extra digits    Past Surgical History:  Procedure Laterality Date   ARTHROSCOPIC REPAIR ACL     INCISION AND DRAINAGE Left 08/25/2020   Procedure: INCISION AND DRAINAGE LEG DEEP ABSCESS/HEMATOMA;  Surgeon: Cristy Bonner DASEN, MD;  Location: [facility] OR;  Service: Orthopedics;  Laterality: Left;   KNEE ARTHROSCOPY Left 08/25/2020   Procedure: ARTHROSCOPY LEFT KNEE FOR INFECTION A LAVAGE AND DRAINAGE;  Surgeon: Cristy Bonner DASEN, MD;  Location: [facility] OR;  Service: Orthopedics;  Laterality: Left;   URETHRA SURGERY     Social History:  reports that he has never smoked. He has never used smokeless tobacco. He reports current alcohol use of about 1.0 standard drink of alcohol per week. He reports current drug use. Drug: Marijuana.  Allergies  Allergen Reactions   Bee Venom Swelling    Not anaphylaxis yet    History reviewed. No pertinent family history.  Prior to Admission medications   Medication Sig Start Date End Date Taking? Authorizing Provider  albuterol (VENTOLIN HFA) 108 (90 Base) MCG/ACT inhaler Inhale 1-2 puffs into the lungs every 6 (six) hours as needed for wheezing or shortness of breath. 07/13/20  Yes [provider]  amoxicillin-clavulanate (AUGMENTIN) 875-125 MG tablet Take 1 tablet by mouth every 12 (twelve) hours. 01/26/24  Yes Upstill, Shari, PA-C  HYDROcodone-acetaminophen  (NORCO/VICODIN) 5-325 MG tablet Take 1 tablet by mouth every 6 (six) hours as needed (Pain). 07/28/20  Yes [provider]  ibuprofen  (ADVIL ) 600 MG tablet Take 1 tablet (600 mg total) by mouth every 6 (six) hours as needed. 01/26/24  Yes Upstill, Margit, PA-C  oxyCODONE -acetaminophen  (PERCOCET/ROXICET) 5-325 MG tablet Take 1 tablet by mouth every 6 (six) hours as needed for severe pain (pain score 7-10). 01/26/24  Yes Upstill, Shari, PA-C  tiZANidine  (ZANAFLEX ) 2 MG tablet Take 1 tablet (2 mg total) by mouth every 8 (eight) hours as needed for muscle spasms.  04/27/21  Yes Arlyss Leita BRAVO, PA-C    Physical Exam: Vitals:   01/28/24 0112 01/28/24 0440 01/28/24 0442  BP: (!) 154/70  (!) 141/77  Pulse: 80  (!) 55  Resp: 18  16  Temp: 98.8 F (37.1 C)  98.5 F (36.9 C)  TempSrc:   Oral  SpO2: 100%  100%  Weight:  87.1 kg   Height:  6' 3 (1.905 m)    Physical Exam Vitals and nursing note reviewed.  Constitutional:      General: He is awake. He is not in acute distress.    Appearance: Normal appearance. He is ill-appearing.  HENT:     Head: Normocephalic.     Nose: No rhinorrhea.     Mouth/Throat:     Mouth: Mucous membranes are dry.   Eyes:     General: No scleral icterus.    Pupils: Pupils are equal, round, and reactive to light.   Neck:     Vascular: No JVD.   Cardiovascular:     Rate and Rhythm: Normal rate and regular rhythm.     Heart sounds: S1 normal and S2 normal.  Pulmonary:     Effort: Pulmonary effort is normal.     Breath sounds: Normal breath sounds. No wheezing or rales.  Abdominal:     General: There is no distension.     Palpations: Abdomen is soft.     Tenderness: There is no right CVA tenderness or left CVA tenderness.   Musculoskeletal:     Cervical back: Neck supple.     Right lower leg: No edema.     Left lower leg: No edema.   Skin:    General: Skin is warm and dry.   Neurological:     General: No focal deficit present.     Mental Status: He is alert and oriented to person, place, and time.   Psychiatric:        Mood and Affect: Mood normal.        Behavior: Behavior normal. Behavior is cooperative.          Data Reviewed:  Results are pending, will review when available.  EKG: Vent. rate 57 BPM PR interval 148 ms QRS duration 96 ms QT/QTcB 398/387 ms P-R-T axes 45 89 15 Sinus bradycardia with sinus arrhythmia Incomplete right bundle branch block Borderline ECG   Assessment and Plan: Principal Problem:   Cellulitis of right hand Admit to medsurg/inpatient. Analgesics  as needed.  Antiemetics as needed. Continue ceftriaxone  1 g IVPB every 24 hours. Continue vancomycin  per pharmacy. Follow-up blood culture and sensitivity Follow CBC and CMP in a.m. Orthopedic consult appreciated.  Active Problems:   Thrombocytopenia (HCC) Monitor platelet count.    Normocytic anemia Monitor hematocrit hemoglobin.    Mild protein malnutrition (HCC) In the setting of anemia and acute illness. Follow-up albumin level.     Advance Care Planning:   Code Status: Full Code   Consults: Orthopedic surgery Carolan Parker, MD).  Family Communication: His mother was at bedside.  Severity  of Illness: The appropriate patient status for this patient is OBSERVATION. Observation status is judged to be reasonable and necessary in order to provide the required intensity of service to ensure the patient's safety. The patient's presenting symptoms, physical exam findings, and initial radiographic and laboratory data in the context of their medical condition is felt to place them at decreased risk for further clinical deterioration. Furthermore, it is anticipated that the patient will be medically stable for discharge from the hospital within 2 midnights of admission.   Author: Alm Dorn Castor, MD 01/28/2024 7:49 AM  For on call review [email or web address].   This document was prepared using Dragon voice recognition software and may contain some unintended transcription errors.

## 2024-01-28 NOTE — Progress Notes (Signed)
   01/28/24 1013  TOC Brief Assessment  Insurance and Status Reviewed  Patient has primary care physician No (No PCP on file, patient has private insurance)  Home environment has been reviewed resides in a private residence  Prior level of function: Independent  Prior/Current Home Services No current home services  Social Drivers of Health Review SDOH reviewed no interventions necessary  Readmission risk has been reviewed Yes  Transition of care needs no transition of care needs at this time

## 2024-01-29 DIAGNOSIS — L03113 Cellulitis of right upper limb: Secondary | ICD-10-CM | POA: Diagnosis not present

## 2024-01-29 MED ORDER — AMOXICILLIN-POT CLAVULANATE 875-125 MG PO TABS: 1.0000 | ORAL_TABLET | Freq: Two times a day (BID) | ORAL | 0 refills | Status: AC

## 2024-01-29 MED ORDER — IBUPROFEN 600 MG PO TABS: 600.0000 mg | ORAL_TABLET | Freq: Four times a day (QID) | ORAL | 0 refills | Status: DC | PRN

## 2024-01-29 MED ORDER — HYDROCODONE-ACETAMINOPHEN 5-325 MG PO TABS: 1.0000 | ORAL_TABLET | Freq: Four times a day (QID) | ORAL | 0 refills | Status: AC | PRN

## 2024-01-29 NOTE — Progress Notes (Signed)
 Occupational Therapy Treatment Patient Details Name: Blake Perry MRN: [id number] DOB: August 20, 2000 Today's Date: 01/29/2024   History of present illness Pt is a 23 y.o. male admitted with R index finger dog bite injury. Ortho consulted, no surgical intervention recommended and OT consulted for early ROM for finger.   OT comments  Pt sitting up upon therapy arrival. Pt agreeable to participating in OT treatment session. Pt reports continued pain and limited tolerance with mobility of right index. Pt educated on hand HEP including gentle soft tissue mobilization, edema management, and ROM. Pt verbalized understanding. Handout provided for reference. Pt reports plan to discharge today. Pt to continue with outpatient hand therapy when discharged.       If plan is discharge home, recommend the following:  Assist for transportation   Equipment Recommendations  None recommended by OT       Precautions / Restrictions Precautions Precautions: None Restrictions Weight Bearing Restrictions Per Provider Order: No              ADL either performed or assessed with clinical judgement    Extremity/Trunk Assessment Upper Extremity Assessment Upper Extremity Assessment: Right hand dominant;RUE deficits/detail RUE Deficits / Details: Pt able to demonstrate full wrist flexion/extension and supination/pronation. Very limited MCP, PIP, DIP able to demonstrate ~5-10 degrees flexion with MCP and PIP joint. Unable to tolerate any light touch to DIP joint at this time. Minimal edema noted proximal to MCP joint. Non-pitting edema noted at PIP and DIP joint. Prefered resting position with DIP, PIP, and MCP slightly flexed. RUE Coordination: decreased fine motor;decreased gross motor   Lower Extremity Assessment Lower Extremity Assessment: Overall WFL for tasks assessed   Cervical / Trunk Assessment Cervical / Trunk Assessment: Normal             Communication Communication Communication: No  apparent difficulties   Cognition Arousal: Alert Behavior During Therapy: WFL for tasks assessed/performed Cognition: No apparent impairments       Following commands: Intact        Cueing   Cueing Techniques: Verbal cues  Exercises Other Exercises Other Exercises: Pt education provided on following MD's recommendation for hand soak 3X's day although recommended use of ice to help decrease pain and edema. Other Exercises: HEP provided including finger desensitization, P/ROM MCP, PIP, and DIP joint, self soft tissue mobilization, finger extension, 5-10X a day, several times a day. Other Exercises: OT complete soft tissue mobilization to right hand MCP and PIP joint in order to decrease fascial retrictions, edema and increase ROM within pt's pain tolerance.            Pertinent Vitals/ Pain       Pain Assessment Pain Assessment: Faces Faces Pain Scale: Hurts worst Pain Location: Right hand index finger Pain Descriptors / Indicators: Guarding, Grimacing, Sharp, Constant, Aching Pain Intervention(s): Monitored during session         Frequency  Min 2X/week        Progress Toward Goals  OT Goals(current goals can now be found in the care plan section)  Progress towards OT goals: Progressing toward goals            AM-PAC OT 6 Clicks Daily Activity     Outcome Measure   Help from another person eating meals?: None Help from another person taking care of personal grooming?: None Help from another person toileting, which includes using toliet, bedpan, or urinal?: None Help from another person bathing (including washing, rinsing, drying)?: None Help from another person  to put on and taking off regular upper body clothing?: None Help from another person to put on and taking off regular lower body clothing?: None 6 Click Score: 24    End of Session    OT Visit Diagnosis: Pain Pain - Right/Left: Right Pain - part of body: Hand   Activity Tolerance Patient  limited by pain   Patient Left in chair;with nursing/sitter in room;with call bell/phone within reach           Time: 1415-1445 OT Time Calculation (min): 30 min  Charges: OT General Charges $OT Visit: 1 Visit OT Treatments $Therapeutic Exercise: 23-37 mins $Massage: 8-22 mins  Blake Perry, OTR/L,CBIS  Supplemental OT - [facility] and [employer] Secure Chat Preferred    Blake Perry, Blake BIRCH 01/29/2024, 3:23 PM

## 2024-01-29 NOTE — Progress Notes (Addendum)
 Call to [facility] to check on status of prescriptions. Aumentin can not be filled under insurance until 01/31/24. Cost without insurance is $34. Pt will pick up additional Augmentin & ibuprofen  on 7/4 when insurance covers it.Pt has augmentin and ibuprofen  at home to take from ED visit for same dog bite this past Saturday. [facility] pm [facility]-pt verbalized an understanding of when to pick up meds. AVS reviewed w/ pt who verbalized an understanding - PIV removed as noted- pt walked to main entrance. Pt verbalized an understanding that he should start Augmentin he has at home tomorrow.

## 2024-01-29 NOTE — Plan of Care (Signed)

## 2024-01-29 NOTE — Progress Notes (Signed)
   Subjective:  Patient reports pain as mild. Much improved today. Was able to work with PT, feeling improved on Iv antibiotics.  Objective:   VITALS:   Vitals:   01/28/24 1436 01/28/24 2047 01/29/24 0457 01/29/24 0500  BP: 136/66 (!) 141/74 (!) 151/67   Pulse: (!) 58 65 (!) 54   Resp: 16 18 16    Temp: 98 F (36.7 C) 98.9 F (37.2 C) 98.3 F (36.8 C)   TempSrc: Oral Oral Oral   SpO2: 100% 100% 100%   Weight:    85.5 kg  Height:        Pain improved/swelling improved. Little to no tenderness about the proximal A1 pulley or volar aspect of the proximal feelings. He has pain with passive extension although again this is difficult to assess with his dog bite injury. He does not have fusiform swelling or evidence of redness erythema. 2+ radial pulse   Lab Results  Component Value Date   WBC 4.4 01/28/2024   HGB 12.1 (L) 01/28/2024   HCT 36.1 (L) 01/28/2024   MCV 88.9 01/28/2024   PLT 130 (L) 01/28/2024     Assessment/Plan:  23 year old male with right IF dogbite, overall no concern for flexor tenosynovitis. Improved today with IV antibiotics and OT/warm water baths.  -Would recommend IV antibiotics through remainder of day, okay for DC this PM -Continue OT and 3 times warm water soapy baths   Justeen Hehr 01/29/2024, 7:59 AM

## 2024-01-29 NOTE — TOC Progression Note (Signed)
 Transition of Care [facility]) - Progression Note    Patient Details  Name: Blake Perry MRN: [id number] Date of Birth: March 31, 2001  Transition of Care [facility]) CM/SW Contact  Alfonse JONELLE Rex, RN Phone Number: 01/29/2024, 8:59 AM  Clinical Narrative:  SNF auth approved. Auth ID [id number], days approved :  01/28/2024-01/30/2024, team notified.           Expected Discharge Plan and Services                                               Social Determinants of Health (SDOH) Interventions SDOH Screenings   Food Insecurity: No Food Insecurity (01/28/2024)  Housing: Low Risk  (01/28/2024)  Transportation Needs: No Transportation Needs (01/28/2024)  Utilities: Not At Risk (01/28/2024)  Tobacco Use: Low Risk  (01/28/2024)    Readmission Risk Interventions     No data to display

## 2024-01-29 NOTE — Discharge Summary (Signed)
 Physician Discharge Summary   Patient: Blake Perry MRN: [id number] DOB: [birth year]  Admit date:     01/28/2024  Discharge date: 01/29/24  Discharge Physician: Adriana DELENA Grams   PCP: Pcp, No   Recommendations at discharge:    antibiotics and OT/warm water baths. Follow-up with orthopedic/hand surgeon Dr. Elspeth Parker in 1 week  Continue antibiotic-Augmentin (s/p 2 days of IV antibiotics Rocephin , vancomycin ) Continue OT  3 times warm water soapy baths Recommending following with PCP closely for chronic anemia, thrombocytopenia Might be reactive due to infection, CBC in 1 week, results to PCP  Discharge Diagnoses: Principal Problem:   Cellulitis of right hand Active Problems:   Thrombocytopenia (HCC)   Normocytic anemia   Mild protein malnutrition (HCC)  Blake Perry is a 23 y.o. male with medical history significant of DVT of left lower extremity, asthma who presented to the emergency department complaints of right index dog bite that happened on 01/25/2024 with progressively worse and pain despite taking Augmentin.  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  They have been in the house of the head colds.  He was standing his hand and a dog when he was bitten. He denied fever, chills, rhinorrhea, sore throat, wheezing or hemoptysis.  No chest pain, palpitations, diaphoresis, PND, orthopnea or pitting edema of the lower extremities.  No abdominal pain, nausea, emesis, diarrhea, constipation, melena or hematochezia.  No flank pain, dysuria, frequency or hematuria.  No polyuria, polydipsia, polyphagia or blurred vision.    Lab work: CBC was normal.  BMP showed a creatinine of 1.33 mg deciliter but was otherwise unremarkable.  Normal PT, INR, CRP and sed rate.  Repeat CBC showed a hemoglobin of 12.1 g/dL.  Repeat CMP showed total protein 6.7 albumin 3.4 g/dL, the rest of the CMP measurements were normal after calcium level  correction.   Imaging: Right index radiograph taken 2 days ago was normal.   ED course: Initial vital signs were temperature 98.9 F, pulse 80, respiration 18, BP 154/70 mmHg O2 sat 100% on room air.  The patient received ceftriaxone  1 g IVPB, hydromorphone  1 mg IVPB, LR 1000 mL bolus, morphine  6 mg IV.  And vancomycin .      Cellulitis of right hand - Patient was admitted, started on IV antibiotics of vancomycin  and Rocephin  -WBC 5.8, 4.4, CRP< 0.5, sed rate 3, -Blood cultures were obtained, no growth to date -Status post evaluation by orthopedic/hand surgeon Dr. Elspeth Parker  Who recommended another dose of IV antibiotics today concluding 2 days of antibiotics, no intervention -during oral antibiotics Augmentin Continue OT ---  3 times warm water soapy baths        Thrombocytopenia (HCC) -monitoring as an outpatient, follow-up with PCP     Normocytic anemia -commending monitoring hemoglobin hematocrit as outpatient,     Mild protein malnutrition (HCC) In the setting of anemia and acute illness. Albumin 3.4 encourage healthier high-protein diet     Pain control - [place]  Controlled Substance Reporting System database was reviewed. and patient was instructed, not to drive, operate heavy machinery, perform activities at heights, swimming or participation in water activities or provide baby-sitting services while on Pain, Sleep and Anxiety Medications; until their outpatient Physician has advised to do so again. Also recommended to not to take more than prescribed Pain, Sleep and Anxiety Medications.  Consultants: Orthopedic/hand surgeon Procedures performed: None Disposition: Home Diet recommendation:  Discharge Diet Orders (From  admission, onward)     Start     Ordered   01/29/24 0000  Diet - low sodium heart healthy        01/29/24 1245           Regular diet DISCHARGE MEDICATION: Allergies as of 01/29/2024       Reactions   Bee Venom Swelling   Not  anaphylaxis yet        Medication List     STOP taking these medications    oxyCODONE -acetaminophen  5-325 MG tablet Commonly known as: PERCOCET/ROXICET   tiZANidine  2 MG tablet Commonly known as: ZANAFLEX        TAKE these medications    albuterol 108 (90 Base) MCG/ACT inhaler Commonly known as: VENTOLIN HFA Inhale 1-2 puffs into the lungs every 6 (six) hours as needed for wheezing or shortness of breath.   amoxicillin-clavulanate 875-125 MG tablet Commonly known as: AUGMENTIN Take 1 tablet by mouth every 12 (twelve) hours. What changed: Another medication with the same name was added. Make sure you understand how and when to take each.   amoxicillin-clavulanate 875-125 MG tablet Commonly known as: AUGMENTIN Take 1 tablet by mouth 2 (two) times daily for 7 days. What changed: You were already taking a medication with the same name, and this prescription was added. Make sure you understand how and when to take each.   HYDROcodone-acetaminophen  5-325 MG tablet Commonly known as: NORCO/VICODIN Take 1 tablet by mouth every 6 (six) hours as needed for up to 3 days (Pain).   ibuprofen  600 MG tablet Commonly known as: ADVIL  Take 1 tablet (600 mg total) by mouth every 6 (six) hours as needed for moderate pain (pain score 4-6). What changed: reasons to take this        Follow-up Information     Schedule an appointment as soon as possible for a visit  with Connect with your PCP/Specialist as discussed.   Contact information: [email or web address] Call our physician referral line at [phone].               Discharge Exam: Filed Weights   01/28/24 0440 01/29/24 0500  Weight: 87.1 kg 85.5 kg        General:  AAO x 3,  cooperative, no distress;   HEENT:  Normocephalic, PERRL, otherwise with in Normal limits   Neuro:  CNII-XII intact. , normal motor and sensation, reflexes intact   Lungs:   Clear to auscultation BL, Respirations  unlabored,  No wheezes / crackles  Cardio:    S1/S2, RRR, No murmure, No Rubs or Gallops   Abdomen:  Soft, non-tender, bowel sounds active all four quadrants, no guarding or peritoneal signs.  Muscular  skeletal:  All within normal limits, exception of Right interphalangeal lesion, with edema, limited range of motion due to edema, pain, strong pulses present  -Moving all 4 extremities without any limitations 2+ pulses,  symmetric,  Skin:  Dry, warm to touch, negative for any Rashes,  Wounds: Please see nursing documentation              Condition at discharge: good  The results of significant diagnostics from this hospitalization (including imaging, microbiology, ancillary and laboratory) are listed below for reference.   Imaging Studies: DG Finger Index Right Result Date: 01/26/2024 CLINICAL DATA:  Bite injury from dog last evening. EXAM: RIGHT INDEX FINGER 2+V COMPARISON:  02/03/2023 FINDINGS: There is no evidence of fracture or dislocation. There is no evidence of arthropathy or other focal bone abnormality. Soft  tissues are unremarkable. IMPRESSION: Negative. Electronically Signed   By: Waddell Calk M.D.   On: 01/26/2024 09:13    Microbiology: Results for orders placed or performed during the hospital encounter of 01/28/24  Blood culture (routine x 2)     Status: None (Preliminary result)   Collection Time: 01/28/24  2:17 AM   Specimen: BLOOD  Result Value Ref Range Status   Specimen Description   Final    BLOOD RIGHT ANTECUBITAL Performed at [facility], [address] [place], [place] [address]    Special Requests   Final    BOTTLES DRAWN AEROBIC AND ANAEROBIC Blood Culture results may not be optimal due to an inadequate volume of blood received in culture bottles Performed at [facility], [address], [place], [place] [address]    Culture   Final    NO GROWTH 1 DAY Performed at [facility], [address] [place], [place] [address]    Report Status PENDING  Incomplete  Blood culture (routine x 2)     Status: None (Preliminary result)   Collection Time: 01/28/24  2:17 AM   Specimen: BLOOD  Result Value Ref Range Status   Specimen Description   Final    BLOOD LEFT ANTECUBITAL Performed at [facility], [address], [place], [place] [address]    Special Requests   Final    BOTTLES DRAWN AEROBIC AND ANAEROBIC Blood Culture results may not be optimal due to an inadequate volume of blood received in culture bottles Performed at [facility], [address] [place], [place] [address]    Culture   Final    NO GROWTH 1 DAY Performed at [facility], [address], [place], [place] [address]    Report Status PENDING  Incomplete    Labs: CBC: Recent Labs  Lab 01/28/24 0156 01/28/24 0628  WBC 5.8 4.4  NEUTROABS 2.5 1.8  HGB 13.3 12.1*  HCT 40.2 36.1*  MCV 89.3 88.9  PLT 175 130*   Basic Metabolic Panel: Recent Labs  Lab 01/28/24 0156 01/28/24 0628  NA 135 139  K 4.4 4.0  CL 100 102  CO2 27 27  GLUCOSE 99 95  BUN 20 16  CREATININE 1.33* 1.12  CALCIUM 8.9 8.8*  MG  --  2.0   Liver Function Tests: Recent Labs  Lab 01/28/24 0628  AST 21  ALT 14  ALKPHOS 57  BILITOT 0.6  PROT 6.0*  ALBUMIN 3.4*   CBG: No results for input(s): GLUCAP in the last 168 hours.  Discharge time spent: greater than 30 minutes.  Signed: Adriana DELENA Grams, MD [facility] 01/29/2024

## 2024-01-30 ENCOUNTER — Encounter (HOSPITAL_COMMUNITY): Payer: Self-pay | Admitting: Emergency Medicine

## 2024-01-30 ENCOUNTER — Emergency Department (HOSPITAL_COMMUNITY)
Admission: EM | Admit: 2024-01-30 | Discharge: 2024-01-30 | Disposition: A | Discharge status: Home / Self Care | Attending: Emergency Medicine | Admitting: Emergency Medicine

## 2024-01-30 DIAGNOSIS — Z5189 Encounter for other specified aftercare: Secondary | ICD-10-CM

## 2024-01-30 DIAGNOSIS — Z4800 Encounter for change or removal of nonsurgical wound dressing: Secondary | ICD-10-CM | POA: Insufficient documentation

## 2024-01-30 NOTE — ED Provider Notes (Signed)
 [place] EMERGENCY DEPARTMENT AT [facility] Provider Note   CSN: [id number] Arrival date & time: 01/30/24  1511     Patient presents with: Wound Infection   Blake Perry is a 23 y.o. male with past medical history of thrombocytopenia, anemia presents to emergency department for wound check of right index finger wound from a dog bite.  He was admitted for past 2 days and discharged yesterday for IV antibiotics, Ortho surgery consult.  Patient was discharged with Augmentin  yesterday and has been taking antibiotics as prescribed.    Today, they sought ED evaluation as wound is still draining with odorous pus and has not improved since DC.   HPI     Prior to Admission medications   Medication Sig Start Date End Date Taking? Authorizing Provider  albuterol (VENTOLIN HFA) 108 (90 Base) MCG/ACT inhaler Inhale 1-2 puffs into the lungs every 6 (six) hours as needed for wheezing or shortness of breath. 07/13/20   [provider]  amoxicillin -clavulanate (AUGMENTIN ) 875-125 MG tablet Take 1 tablet by mouth every 12 (twelve) hours. 01/26/24   Odell Balls, PA-C  amoxicillin -clavulanate (AUGMENTIN ) 875-125 MG tablet Take 1 tablet by mouth 2 (two) times daily for 7 days. 01/29/24 02/05/24  Willette Adriana LABOR, MD  HYDROcodone -acetaminophen  (NORCO/VICODIN) 5-325 MG tablet Take 1 tablet by mouth every 6 (six) hours as needed for up to 3 days (Pain). 01/29/24 02/01/24  Willette Adriana LABOR, MD  ibuprofen  (ADVIL ) 600 MG tablet Take 1 tablet (600 mg total) by mouth every 6 (six) hours as needed for moderate pain (pain score 4-6). 01/29/24   Willette Adriana LABOR, MD    Allergies: Bee venom    Review of Systems  Skin:  Positive for wound.    Updated Vital Signs BP (!) 153/51   Pulse 84   Temp 98.8 F (37.1 C) (Oral)   Resp 16   SpO2 100%   Physical Exam Vitals and nursing note reviewed.  Constitutional:      General: He is not in acute distress.    Appearance: Normal appearance.   HENT:     Head: Normocephalic and atraumatic.  Eyes:     Conjunctiva/sclera: Conjunctivae normal.  Cardiovascular:     Rate and Rhythm: Normal rate.  Pulmonary:     Effort: Pulmonary effort is normal. No respiratory distress.     Breath sounds: Normal breath sounds.  Musculoskeletal:     Comments: Pain with passive extension. Decreased ROM 2/2 pain, mild swelling. Well perfused digit. Radial 2+. Sensation 2/2 of right index finger  Skin:    Coloration: Skin is not jaundiced or pale.  Neurological:     Mental Status: He is alert and oriented to person, place, and time. Mental status is at baseline.        (all labs ordered are listed, but only abnormal results are displayed) Labs Reviewed - No data to display  EKG: None  Radiology: No results found.    Medications Ordered in the ED - No data to display                                  Medical Decision Making    Patient presents to the ED for concern of right finger pain, swelling, drainage, this involves an extensive number of treatment options, and is a complaint that carries with it a high risk of complications and morbidity.  The differential diagnosis includes new  injury, cellulitis, flexor tenosynovitis   Co morbidities that complicate the patient evaluation  Recent dog bite   Additional history obtained:  Additional history obtained from Nursing, Outside Medical Records, and Past Admission   External records from outside source obtained and reviewed including ER note, recent admission note, hand surgery consult     Problem List / ED Course:  Animal bite Cellulitis Looks much improved swelling wise from finger 2 days ago S/P IV antibiotics Continues to have pain with passive extension and decreased ROM of right index finger.  However, it is well-perfused with sensation intact. Per orthopedic surgery note, low suspicion for flexor tenosynovitis.  They recommended outpatient follow-up in 1 week with  orthopedic surgery for further management, Augmentin  7-day course.  Discussed that patient can call orthopedic surgery and have appointment moved up if he is concerned and wanting wound recheck.  Provided number of orthopedic surgeon to follow-up with Discussed importance of continuing antibiotic bacterial soaks, OT as recommended by orthopedic surgery.  Discussed avoiding soaking hand in dirty water, pool, bathtub, dishwater Discussed strict return precautions, antibiotic stewardship   Reevaluation:  After the interventions noted above, I reevaluated the patient and found that they have :stayed the same    Dispostion:  After consideration of the diagnostic results and the patients response to treatment, I feel that the patent would benefit from outpatient management with orthopedic follow-up next week.   Discussed ED workup, disposition, return to ED precautions with patient who expresses understanding agrees with plan.  All questions answered to their satisfaction.  They are agreeable to plan.  Discharge instructions provided on paperwork  Final diagnoses:  Encounter for wound re-check    ED Discharge Orders     None          Minnie Tinnie BRAVO, PA 01/30/24 2334    Laurice Maude BROCKS, MD 01/31/24 2240

## 2024-01-30 NOTE — ED Triage Notes (Signed)
 Patient report index finger wound  from a dog bite is still infected. Patient report wound still draining with pus.  Patient report he got Discharge yesterday and send with oral antibiotics and pain medicine. Patient denies fever. Patient denies N/V.

## 2024-01-30 NOTE — Discharge Instructions (Addendum)
 Thank you for letting us  evaluate you today.  Please follow-up with Ortho surgery next week.  Wound appears similar to pictures yesterday.  Please continue OT, antibacterial soaks, elevation above heart, ice for swelling.  Please avoid submerging hand in dirty soapy water including dishwater, pool water, hot tub, bath water.  You may shower normally.  Try to keep wound open to allow for airflow.  Continue taking Augmentin until you follow-up with hand surgery.  You may take Tylenol  and ibuprofen  intermittently every 8 hours for pain, fever.  Fever is considered greater than 100.4 F.  A low-grade fever around this should be treated with Tylenol , ibuprofen .  If fever does not improve following this, return to emergency department.  Also please establish care with a primary care doctor for routine complaints, annual visits.  You were noted to have anemia (mildly decreased blood counts) and thrombocytopenia (mildly decreased platelet counts).  This needs to be monitored by a primary care provider  Return to emergency department if you experience significant worsening of swelling, pain

## 2024-02-02 LAB — CULTURE, BLOOD (ROUTINE X 2)
Culture: NO GROWTH
Culture: NO GROWTH

## 2024-02-03 ENCOUNTER — Ambulatory Visit (HOSPITAL_BASED_OUTPATIENT_CLINIC_OR_DEPARTMENT_OTHER): Admitting: Student

## 2024-02-05 ENCOUNTER — Ambulatory Visit (HOSPITAL_BASED_OUTPATIENT_CLINIC_OR_DEPARTMENT_OTHER): Admitting: Student

## 2024-03-10 ENCOUNTER — Ambulatory Visit
Admission: EM | Admit: 2024-03-10 | Discharge: 2024-03-10 | Disposition: A | Discharge status: Home / Self Care | Attending: Family Medicine | Admitting: Family Medicine

## 2024-03-10 DIAGNOSIS — J069 Acute upper respiratory infection, unspecified: Secondary | ICD-10-CM | POA: Insufficient documentation

## 2024-03-10 DIAGNOSIS — R070 Pain in throat: Secondary | ICD-10-CM | POA: Insufficient documentation

## 2024-03-10 LAB — POCT RAPID STREP A (OFFICE): Rapid Strep A Screen: NEGATIVE

## 2024-03-10 MED ORDER — PREDNISONE 20 MG PO TABS: ORAL_TABLET | ORAL | 0 refills | Status: DC

## 2024-03-10 MED ORDER — PROMETHAZINE-DM 6.25-15 MG/5ML PO SYRP: 5.0000 mL | ORAL_SOLUTION | Freq: Three times a day (TID) | ORAL | 0 refills | Status: AC | PRN

## 2024-03-10 NOTE — ED Triage Notes (Signed)
 Patient is here for sore throat x 4 days. He is taking Tylenol  and Motrin . Denies any other symptoms.

## 2024-03-10 NOTE — ED Provider Notes (Signed)
 [facility]  Note:  This document was prepared using Conservation officer, historic buildings and may include unintentional dictation errors.  MRN: [id number] DOB: Aug 21, 2000  Subjective:   Blake Perry is a 23 y.o. male presenting for 4-day history of throat pain, runny and stuffy nose, coughing.  No chest pain, shortness of breath or wheezing.  He does have a history of asthma but has not needed an inhaler in some time.  No concern for STI of the throat.  Patient smokes marijuana a couple of times weekly.   No current facility-administered medications for this encounter.  Current Outpatient Medications:    ibuprofen  (ADVIL ) 600 MG tablet, Take 1 tablet (600 mg total) by mouth every 6 (six) hours as needed for moderate pain (pain score 4-6)., Disp: 30 tablet, Rfl: 0   albuterol (VENTOLIN HFA) 108 (90 Base) MCG/ACT inhaler, Inhale 1-2 puffs into the lungs every 6 (six) hours as needed for wheezing or shortness of breath., Disp: , Rfl:    amoxicillin -clavulanate (AUGMENTIN ) 875-125 MG tablet, Take 1 tablet by mouth every 12 (twelve) hours., Disp: 14 tablet, Rfl: 0   Allergies  Allergen Reactions   Bee Venom Swelling    Not anaphylaxis yet    Past Medical History:  Diagnosis Date   Acute deep vein thrombosis (DVT) of left lower extremity after procedure (HCC)    Asthma    Extra digits      Past Surgical History:  Procedure Laterality Date   ARTHROSCOPIC REPAIR ACL     INCISION AND DRAINAGE Left 08/25/2020   Procedure: INCISION AND DRAINAGE LEG DEEP ABSCESS/HEMATOMA;  Surgeon: Cristy Bonner DASEN, MD;  Location: [facility] OR;  Service: Orthopedics;  Laterality: Left;   KNEE ARTHROSCOPY Left 08/25/2020   Procedure: ARTHROSCOPY LEFT KNEE FOR INFECTION A LAVAGE AND DRAINAGE;  Surgeon: Cristy Bonner DASEN, MD;  Location: [facility] OR;  Service: Orthopedics;  Laterality: Left;   URETHRA SURGERY      History reviewed. No pertinent family history.  Social History   Tobacco Use   Smoking  status: Never   Smokeless tobacco: Never  Vaping Use   Vaping status: Former  Substance Use Topics   Alcohol use: Yes    Alcohol/week: 1.0 standard drink of alcohol    Types: 1 Cans of beer per week    Comment: beer occasionally   Drug use: Yes    Types: Marijuana    ROS   Objective:   Vitals: BP 132/87 (BP Location: Left Arm)   Pulse 61   Temp 97.7 F (36.5 C) (Oral)   Resp 17   SpO2 98%   Physical Exam Constitutional:      General: He is not in acute distress.    Appearance: Normal appearance. He is well-developed and normal weight. He is not ill-appearing, toxic-appearing or diaphoretic.  HENT:     Head: Normocephalic and atraumatic.     Right Ear: Tympanic membrane, ear canal and external ear normal. No drainage, swelling or tenderness. No middle ear effusion. There is no impacted cerumen. Tympanic membrane is not erythematous or bulging.     Left Ear: Tympanic membrane, ear canal and external ear normal. No drainage, swelling or tenderness.  No middle ear effusion. There is no impacted cerumen. Tympanic membrane is not erythematous or bulging.     Nose: Nose normal. No congestion or rhinorrhea.     Mouth/Throat:     Mouth: Mucous membranes are moist.     Pharynx: Oropharynx is clear.  Posterior oropharyngeal erythema (with associated post-nasal drainage in cobblestone pattern) present. No pharyngeal swelling, oropharyngeal exudate or uvula swelling.     Tonsils: No tonsillar exudate or tonsillar abscesses. 0 on the right. 0 on the left.  Eyes:     General: No scleral icterus.       Right eye: No discharge.        Left eye: No discharge.     Extraocular Movements: Extraocular movements intact.     Conjunctiva/sclera: Conjunctivae normal.  Cardiovascular:     Rate and Rhythm: Normal rate and regular rhythm.     Heart sounds: Normal heart sounds. No murmur heard.    No friction rub. No gallop.  Pulmonary:     Effort: Pulmonary effort is normal. No respiratory  distress.     Breath sounds: Normal breath sounds. No stridor. No wheezing, rhonchi or rales.  Musculoskeletal:     Cervical back: Normal range of motion and neck supple. No rigidity. No muscular tenderness.  Neurological:     General: No focal deficit present.     Mental Status: He is alert and oriented to person, place, and time.  Psychiatric:        Mood and Affect: Mood normal.        Behavior: Behavior normal.        Thought Content: Thought content normal.        Judgment: Judgment normal.     Results for orders placed or performed during the hospital encounter of 03/10/24 (from the past 24 hours)  POCT rapid strep A     Status: None   Collection Time: 03/10/24  4:10 PM  Result Value Ref Range   Rapid Strep A Screen Negative Negative    Assessment and Plan :   PDMP not reviewed this encounter.  1. Viral upper respiratory infection   2. Throat pain    Patient reports severe throat pain and given his respiratory symptoms, difficulty swallowing that all I offered an oral prednisone  course.  Recommend supportive care otherwise for viral upper respiratory infection.  Throat culture pending.  Patient declined STI testing, oral cytology.  No signs of retropharyngeal abscess, tonsillitis or airway compromise.  Counseled patient on potential for adverse effects with medications prescribed/recommended today, ER and return-to-clinic precautions discussed, patient verbalized understanding.    [name], [place] 03/10/24 1658

## 2024-03-10 NOTE — Discharge Instructions (Signed)
 We will manage this as a viral respiratory infection, we will let you know about your throat culture in about 3 days. For sore throat or cough try using a honey-based tea. Use 3 teaspoons of honey with juice squeezed from half lemon. Place shaved pieces of ginger into 1/2-1 cup of water and warm over stove top. Then mix the ingredients and repeat every 4 hours as needed. Please take Tylenol  500mg -650mg  once every 6 hours for fevers, aches and pains. Hydrate very well with at least 2 liters (64 ounces) of water. Eat light meals such as soups (chicken and noodles, chicken wild rice, vegetable).  Do not eat any foods that you are allergic to.  Start an antihistamine like Zyrtec  (10mg  daily) for postnasal drainage, sinus congestion.  You can take this together with prednisone  to help with your significant respiratory symptoms and throat pain.

## 2024-03-13 ENCOUNTER — Ambulatory Visit: Payer: Self-pay

## 2024-03-13 LAB — CULTURE, GROUP A STREP (THRC)

## 2024-03-16 ENCOUNTER — Other Ambulatory Visit: Payer: Self-pay

## 2024-03-16 ENCOUNTER — Ambulatory Visit
Admission: EM | Admit: 2024-03-16 | Discharge: 2024-03-16 | Disposition: A | Discharge status: Home / Self Care | Attending: Family Medicine | Admitting: Family Medicine

## 2024-03-16 DIAGNOSIS — J029 Acute pharyngitis, unspecified: Secondary | ICD-10-CM

## 2024-03-16 DIAGNOSIS — K122 Cellulitis and abscess of mouth: Secondary | ICD-10-CM | POA: Diagnosis not present

## 2024-03-16 DIAGNOSIS — J0100 Acute maxillary sinusitis, unspecified: Secondary | ICD-10-CM

## 2024-03-16 MED ORDER — DEXAMETHASONE SODIUM PHOSPHATE 10 MG/ML IJ SOLN
10.0000 mg | Freq: Once | INTRAMUSCULAR | Status: AC
  Administered 2024-03-16: 10 mg via INTRAMUSCULAR

## 2024-03-16 MED ORDER — PREDNISONE 20 MG PO TABS: 40.0000 mg | ORAL_TABLET | Freq: Every day | ORAL | 0 refills | Status: AC

## 2024-03-16 MED ORDER — IPRATROPIUM BROMIDE 0.03 % NA SOLN: 2.0000 | Freq: Two times a day (BID) | NASAL | 0 refills | Status: AC

## 2024-03-16 MED ORDER — AMOXICILLIN-POT CLAVULANATE 875-125 MG PO TABS: 1.0000 | ORAL_TABLET | Freq: Two times a day (BID) | ORAL | 0 refills | Status: AC

## 2024-03-16 MED ORDER — NAPROXEN 500 MG PO TABS: 500.0000 mg | ORAL_TABLET | Freq: Two times a day (BID) | ORAL | 0 refills | Status: AC | PRN

## 2024-03-16 MED ORDER — LIDOCAINE VISCOUS HCL 2 % MT SOLN: 15.0000 mL | Freq: Four times a day (QID) | OROMUCOSAL | 0 refills | Status: AC | PRN

## 2024-03-16 NOTE — ED Triage Notes (Signed)
 Pt c/o sore throat and [id number]. Pt states he was given meds here and it helped 2 days and then it got worse.

## 2024-03-16 NOTE — Discharge Instructions (Addendum)
 Start Augmentin  twice daily for 10 days.  You may use a lidocaine  gargle as needed for your sore throat.  Gargle and spit do not swallow.  You were given a shot of a steroid in the clinic so start the prednisone  tomorrow, 8/19.  Atrovent  nasal spray for your congestion twice daily as needed.  May also take naproxen  twice daily as needed for fevers or throat pain.  He may continue warm liquids such as teas and honey and salt water gargles as needed.  Also over-the-counter ibuprofen  or Tylenol  as needed.  Lots of rest and fluids.  Please follow-up with your PCP if your symptoms do not improve.  Please go to the ER for any worsening symptoms.  Hope you feel better soon!

## 2024-03-16 NOTE — ED Provider Notes (Addendum)
 [facility]    CSN: [id number] Arrival date & time: 03/16/24  0854      History   Chief Complaint No chief complaint on file.   HPI Blake Perry is a 23 y.o. male  presents for evaluation of URI symptoms for 2 weeks. Patient reports associated symptoms of congestion, sore throat. Denies N/V/D, cough, fevers, ear pain, body aches, shortness of breath. Patient does have a hx of asthma.  Denies wheezing or shortness of breath and has not needed to use his inhaler since onset of symptoms.  Patient is not an active smoker.   Reports no known sick contacts.  Patient was seen in urgent care on August 12 for same symptoms.  Had a negative rapid strep and strep throat culture.  He was started on prednisone  and promethazine .  He states that helped for about 2 days but his symptoms worsen.  Pt has taken nothing OTC for symptoms. Pt has no other concerns at this time.   HPI  Past Medical History:  Diagnosis Date   Acute deep vein thrombosis (DVT) of left lower extremity after procedure [facility])    Asthma    Extra digits     Patient Active Problem List   Diagnosis Date Noted   Cellulitis of right hand 01/28/2024   Thrombocytopenia (HCC) 01/28/2024   Normocytic anemia 01/28/2024   Mild protein malnutrition (HCC) 01/28/2024    Past Surgical History:  Procedure Laterality Date   ARTHROSCOPIC REPAIR ACL     INCISION AND DRAINAGE Left 08/25/2020   Procedure: INCISION AND DRAINAGE LEG DEEP ABSCESS/HEMATOMA;  Surgeon: Cristy Bonner DASEN, MD;  Location: [facility];  Service: Orthopedics;  Laterality: Left;   KNEE ARTHROSCOPY Left 08/25/2020   Procedure: ARTHROSCOPY LEFT KNEE FOR INFECTION A LAVAGE AND DRAINAGE;  Surgeon: Cristy Bonner DASEN, MD;  Location: [facility] OR;  Service: Orthopedics;  Laterality: Left;   URETHRA SURGERY         Home Medications    Prior to Admission medications   Medication Sig Start Date End Date Taking? Authorizing Provider  amoxicillin -clavulanate (AUGMENTIN ) 875-125 MG  tablet Take 1 tablet by mouth every 12 (twelve) hours for 10 days. 03/16/24 03/26/24 Yes Egan Berkheimer, Jodi R, NP  ipratropium (ATROVENT ) 0.03 % nasal spray Place 2 sprays into both nostrils every 12 (twelve) hours. 03/16/24  Yes Geet Hosking, Jodi R, NP  lidocaine  (XYLOCAINE ) 2 % solution Use as directed 15 mLs in the mouth or throat every 6 (six) hours as needed (sore throat). 03/16/24  Yes Jermiya Reichl, Jodi R, NP  naproxen  (NAPROSYN ) 500 MG tablet Take 1 tablet (500 mg total) by mouth 2 (two) times daily as needed (sore throat). 03/16/24  Yes Suriya Kovarik, Jodi R, NP  predniSONE  (DELTASONE ) 20 MG tablet Take 2 tablets (40 mg total) by mouth daily with breakfast for 3 days. 03/17/24 03/20/24 Yes Darenda Fike, Jodi R, NP  albuterol (VENTOLIN HFA) 108 (90 Base) MCG/ACT inhaler Inhale 1-2 puffs into the lungs every 6 (six) hours as needed for wheezing or shortness of breath. 07/13/20   [provider]  promethazine -dextromethorphan (PROMETHAZINE -DM) 6.25-15 MG/5ML syrup Take 5 mLs by mouth 3 (three) times daily as needed for cough. 03/10/24   [name], PA-C    Family History History reviewed. No pertinent family history.  Social History Social History   Tobacco Use   Smoking status: Never   Smokeless tobacco: Never  Vaping Use   Vaping status: Former  Substance Use Topics   Alcohol use: Yes  Alcohol/week: 1.0 standard drink of alcohol    Types: 1 Cans of beer per week    Comment: beer occasionally   Drug use: Yes    Types: Marijuana     Allergies   Bee venom   Review of Systems Review of Systems  HENT:  Positive for congestion and sore throat.      Physical Exam Triage Vital Signs ED Triage Vitals  Encounter Vitals Group     BP 03/16/24 0912 118/60     Girls Systolic BP Percentile --      Girls Diastolic BP Percentile --      Boys Systolic BP Percentile --      Boys Diastolic BP Percentile --      Pulse Rate 03/16/24 0912 87     Resp 03/16/24 0912 17     Temp 03/16/24 0912 98.9 F (37.2 C)      Temp Source 03/16/24 0912 Oral     SpO2 03/16/24 0912 96 %     Weight --      Height --      Head Circumference --      Peak Flow --      Pain Score 03/16/24 0910 9     Pain Loc --      Pain Education --      Exclude from Growth Chart --    No data found.  Updated Vital Signs BP 118/60   Pulse 87   Temp 98.9 F (37.2 C) (Oral)   Resp 17   SpO2 96%   Visual Acuity Right Eye Distance:   Left Eye Distance:   Bilateral Distance:    Right Eye Near:   Left Eye Near:    Bilateral Near:     Physical Exam Vitals and nursing note reviewed.  Constitutional:      General: He is not in acute distress.    Appearance: Normal appearance. He is not ill-appearing or toxic-appearing.  HENT:     Head: Normocephalic and atraumatic.     Right Ear: Tympanic membrane and ear canal normal.     Left Ear: Tympanic membrane and ear canal normal.     Nose: Congestion present.     Right Turbinates: Swollen and pale.     Left Turbinates: Swollen and pale.     Right Sinus: Maxillary sinus tenderness present. No frontal sinus tenderness.     Left Sinus: Maxillary sinus tenderness present. No frontal sinus tenderness.     Mouth/Throat:     Mouth: Mucous membranes are moist.     Pharynx: Oropharynx is clear. Uvula midline. Posterior oropharyngeal erythema, uvula swelling and postnasal drip present. No pharyngeal swelling or oropharyngeal exudate.     Tonsils: No tonsillar exudate or tonsillar abscesses.  Eyes:     Pupils: Pupils are equal, round, and reactive to light.  Cardiovascular:     Rate and Rhythm: Normal rate and regular rhythm.     Heart sounds: Normal heart sounds.  Pulmonary:     Effort: Pulmonary effort is normal.     Breath sounds: Normal breath sounds. No wheezing or rhonchi.  Musculoskeletal:     Cervical back: Normal range of motion and neck supple.  Lymphadenopathy:     Cervical: No cervical adenopathy.  Skin:    General: Skin is warm and dry.  Neurological:      General: No focal deficit present.     Mental Status: He is alert and oriented to person, place, and time.  Psychiatric:  Mood and Affect: Mood normal.        Behavior: Behavior normal.      UC Treatments / Results  Labs (all labs ordered are listed, but only abnormal results are displayed) Labs Reviewed - No data to display Comprehensive metabolic panel with GFR Order: [id number]  Status: Final result     Next appt: None   Test Result Released: Yes (seen)   0 Result Notes        Component Ref Range & Units (hover) 1 mo ago (01/28/24) 1 mo ago (01/28/24) 1 yr ago (08/30/22) 3 yr ago (09/08/20) 3 yr ago (08/25/20)  Sodium 139 135 138 140 139  Potassium 4.0 4.4 3.9 4.2 4.1  Chloride 102 100 103 106 98  CO2 27 27 25 27    Glucose, Bld 95 99 CM 109 High  CM 87 CM 97 CM  Comment: Glucose reference range applies only to samples taken after fasting for at least 8 hours.  BUN 16 20 19 18 9   Creatinine, Ser 1.12 1.33 High  1.07 0.99 0.80  Calcium 8.8 Low  8.9 9.4 9.6   Total Protein 6.0 Low   7.2 8.0   Albumin 3.4 Low   3.9 4.6   AST 21  21 24    ALT 14  17 30    Alkaline Phosphatase 57  69 97   Total Bilirubin 0.6  0.3 R 0.3 R   GFR, Estimated >60 >60 CM >60 CM    Comment: (NOTE) Calculated using the CKD-EPI Creatinine Equation (2021)  Anion gap 10 8 CM 10 CM 7 CM   Comment: Performed at [facility], [address] [place], [place] [address]  Resulting Agency [facility] CLIN LAB [facility] CLIN LAB [facility] CLIN LAB [facility] CLIN LAB [facility] CLIN LAB        Specimen Collected: 01/28/24 06:28 Last Resulted: 01/28/24 07:30    EKG   Radiology No results found.  Procedures Procedures (including critical care time)  Medications Ordered in UC Medications  dexamethasone  (DECADRON ) injection 10 mg (10 mg Intramuscular Given 03/16/24 0944)    Initial Impression / Assessment and Plan / UC Course  I have reviewed the triage vital signs and the nursing notes.  Pertinent labs &  imaging results that were available during my care of the patient were reviewed by me and considered in my medical decision making (see chart for details).     Reviewed exam and symptoms with patient.  Will start Augmentin .  Patient given steroid injection in clinic and will start prednisone  daily tomorrow, 8/19.  Lidocaine  gargle as needed.  Naproxen  as needed and Atrovent  nasal spray as needed.  Encouraged salt water gargles and warm liquids such as teas and honey as well as OTC analgesics as needed.  PCP follow-up if symptoms do not improve.  ER precautions reviewed. Final Clinical Impressions(s) / UC Diagnoses   Final diagnoses:  Sore throat  Uvulitis  Acute maxillary sinusitis, recurrence not specified     Discharge Instructions      Start Augmentin  twice daily for 10 days.  You may use a lidocaine  gargle as needed for your sore throat.  Gargle and spit do not swallow.  You were given a shot of a steroid in the clinic so start the prednisone  tomorrow, 8/19.  Atrovent  nasal spray for your congestion twice daily as needed.  May also take naproxen  twice daily as needed for fevers or throat pain.  He may continue warm liquids such as teas and  honey and salt water gargles as needed.  Also over-the-counter ibuprofen  or Tylenol  as needed.  Lots of rest and fluids.  Please follow-up with your PCP if your symptoms do not improve.  Please go to the ER for any worsening symptoms.  Hope you feel better soon!    ED Prescriptions     Medication Sig Dispense Auth. Provider   amoxicillin -clavulanate (AUGMENTIN ) 875-125 MG tablet Take 1 tablet by mouth every 12 (twelve) hours for 10 days. 20 tablet Emidio Warrell, Jodi R, NP   lidocaine  (XYLOCAINE ) 2 % solution Use as directed 15 mLs in the mouth or throat every 6 (six) hours as needed (sore throat). 100 mL Gardiner Espana, Jodi R, NP   predniSONE  (DELTASONE ) 20 MG tablet Take 2 tablets (40 mg total) by mouth daily with breakfast for 3 days. 6 tablet Jaiel Saraceno, Jodi R, NP    ipratropium (ATROVENT ) 0.03 % nasal spray Place 2 sprays into both nostrils every 12 (twelve) hours. 30 mL Jermichael Belmares, Jodi R, NP   naproxen  (NAPROSYN ) 500 MG tablet Take 1 tablet (500 mg total) by mouth 2 (two) times daily as needed (sore throat). 10 tablet Daneisha Surges, Jodi R, NP      PDMP not reviewed this encounter.   Loreda Myla SAUNDERS, NP 03/16/24 9052    Loreda Myla SAUNDERS, NP 03/16/24 9052    Loreda Myla SAUNDERS, NP 03/16/24 [phone]

## 2024-06-01 ENCOUNTER — Encounter: Payer: Self-pay | Admitting: Radiology

## 2024-06-29 ENCOUNTER — Ambulatory Visit (HOSPITAL_BASED_OUTPATIENT_CLINIC_OR_DEPARTMENT_OTHER): Admission: RE | Admit: 2024-06-29 | Discharge: 2024-06-29 | Disposition: A | Source: Ambulatory Visit

## 2024-06-29 ENCOUNTER — Ambulatory Visit

## 2024-06-29 ENCOUNTER — Ambulatory Visit: Payer: Self-pay

## 2024-06-29 VITALS — BP 138/73 | Ht 75.0 in | Wt 190.0 lb

## 2024-06-29 DIAGNOSIS — M25562 Pain in left knee: Secondary | ICD-10-CM | POA: Insufficient documentation

## 2024-06-29 DIAGNOSIS — M2392 Unspecified internal derangement of left knee: Secondary | ICD-10-CM | POA: Diagnosis not present

## 2024-06-29 DIAGNOSIS — M25462 Effusion, left knee: Secondary | ICD-10-CM | POA: Diagnosis not present

## 2024-06-29 NOTE — Progress Notes (Signed)
   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  Left knee pain and functional impairment - Severe, excruciating left knee pain onset last night, persisting into this morning - Pain is similar in character to prior anterior cruciate ligament (ACL) tear - Inability to walk or attempt work due to pain - No use of assistive devices  Left knee swelling and sensation - Marked swelling and tight sensation in the left knee since onset of pain - No clear new twisting injury preceding symptoms  History of left knee ligament and meniscal injury - Prior left ACL, medial collateral ligament (MCL), and meniscus tear in 2021 - Treated surgically  Concerns regarding recurrent injury - Concern for possible ACL re-tear with training camp scheduled to start December 6     Objective:   Vitals:   06/29/24 1529  BP: 138/73    Left Knee (compared to normal) -Inspection: Modest swelling.  No erythema.  No deformity.  Patient unable to bear weight on left leg. -Palpation: TTP - quad tendon, + patella, + patellar tendon, - tibial tuberosity, - pes bursa, - gerdy tubercle, + medial joint line, + lateral joint line, - posterior knee, - medial and lateral hamstrings. No significant crepitus with flexion/extension. -AROM/PROM: 0 degrees extension, 120 degrees flexion, normal hamstring flexibility -Strength: Unable to tolerate single-leg squat, flexion and extension limited 2/2 pain. -Special tests:    -ACL: + Lachman,+ lever, +pivot shift   -MCL: stable and painless with valgus at 0/30 degrees   -LCL: 1-2+ and painless with varus at 0/30 degrees   -PCL: - sag sign   -Meniscus: +  McMurray   -Patellofemoral: + patellar grind      Assessment & Plan:   Assessment & Plan Acute left knee injury with possible ligament tear   He presents with an acute left knee injury characterized by severe pain and inability to bear weight, suggesting a possible ligament tear. There is a history of ACL, MCL, and meniscus injuries on the left knee with surgical repair in 2021. Current symptoms, including swelling and pain, resemble previous ACL injury. Differential diagnosis includes a re-tear of the ACL or another ligamentous injury. Ordered x-rays and scheduled an MRI of the left knee.  Offered crutches for mobility, but patient electing to obtain these from thrift store provide will due to anticipated cost from obtaining these here at our clinic if billed through insurance.

## 2024-07-27 ENCOUNTER — Other Ambulatory Visit: Payer: Self-pay

## 2024-07-27 DIAGNOSIS — S83242A Other tear of medial meniscus, current injury, left knee, initial encounter: Secondary | ICD-10-CM

## 2024-07-27 DIAGNOSIS — S83282A Other tear of lateral meniscus, current injury, left knee, initial encounter: Secondary | ICD-10-CM

## 2024-07-27 DIAGNOSIS — S76199A Other specified injury of unspecified quadriceps muscle, fascia and tendon, initial encounter: Secondary | ICD-10-CM

## 2024-07-27 DIAGNOSIS — M899 Disorder of bone, unspecified: Secondary | ICD-10-CM

## 2024-07-27 DIAGNOSIS — M7122 Synovial cyst of popliteal space [Baker], left knee: Secondary | ICD-10-CM

## 2024-07-27 NOTE — Progress Notes (Signed)
 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 Referring to physical therapy and Dr. Sidra Olmsted.
